# Patient Record
Sex: Male | Born: 1955 | Race: White | Hispanic: No | Marital: Married | Smoking: Former smoker
Health system: Southern US, Community
[De-identification: ages and names within clinical notes are randomized; demographics above are authoritative.]

## PROBLEM LIST (undated history)

## (undated) DIAGNOSIS — I10 Essential (primary) hypertension: Secondary | ICD-10-CM

## (undated) DIAGNOSIS — J42 Unspecified chronic bronchitis: Secondary | ICD-10-CM

## (undated) DIAGNOSIS — F329 Major depressive disorder, single episode, unspecified: Secondary | ICD-10-CM

## (undated) DIAGNOSIS — F419 Anxiety disorder, unspecified: Secondary | ICD-10-CM

## (undated) DIAGNOSIS — E7800 Pure hypercholesterolemia, unspecified: Secondary | ICD-10-CM

## (undated) DIAGNOSIS — N390 Urinary tract infection, site not specified: Secondary | ICD-10-CM

## (undated) DIAGNOSIS — J189 Pneumonia, unspecified organism: Secondary | ICD-10-CM

## (undated) DIAGNOSIS — I4891 Unspecified atrial fibrillation: Secondary | ICD-10-CM

## (undated) DIAGNOSIS — G4733 Obstructive sleep apnea (adult) (pediatric): Secondary | ICD-10-CM

## (undated) DIAGNOSIS — E109 Type 1 diabetes mellitus without complications: Secondary | ICD-10-CM

## (undated) DIAGNOSIS — K219 Gastro-esophageal reflux disease without esophagitis: Secondary | ICD-10-CM

## (undated) DIAGNOSIS — N189 Chronic kidney disease, unspecified: Secondary | ICD-10-CM

## (undated) DIAGNOSIS — Z9989 Dependence on other enabling machines and devices: Secondary | ICD-10-CM

## (undated) DIAGNOSIS — F32A Depression, unspecified: Secondary | ICD-10-CM

## (undated) DIAGNOSIS — L0390 Cellulitis, unspecified: Secondary | ICD-10-CM

## (undated) HISTORY — PX: URETHRAL DILATION: SUR417

## (undated) HISTORY — PX: COLONOSCOPY: SHX174

## (undated) HISTORY — PX: TONSILLECTOMY: SUR1361

## (undated) HISTORY — PX: HIP ARTHROPLASTY: SHX981

---

## 2006-01-06 ENCOUNTER — Encounter: Admission: RE | Admit: 2006-01-06 | Discharge: 2006-01-06 | Payer: Self-pay | Admitting: Orthopaedic Surgery

## 2006-01-15 HISTORY — PX: ANTERIOR CERVICAL DECOMP/DISCECTOMY FUSION: SHX1161

## 2006-02-03 ENCOUNTER — Observation Stay (HOSPITAL_COMMUNITY): Admission: RE | Admit: 2006-02-03 | Discharge: 2006-02-04 | Payer: Self-pay | Admitting: Orthopaedic Surgery

## 2008-02-16 HISTORY — PX: TOTAL SHOULDER ARTHROPLASTY: SHX126

## 2008-03-09 ENCOUNTER — Ambulatory Visit (HOSPITAL_COMMUNITY): Admission: RE | Admit: 2008-03-09 | Discharge: 2008-03-10 | Payer: Self-pay | Admitting: Orthopedic Surgery

## 2010-01-11 ENCOUNTER — Observation Stay (HOSPITAL_COMMUNITY): Admission: RE | Admit: 2010-01-11 | Discharge: 2010-01-12 | Payer: Self-pay | Admitting: Orthopaedic Surgery

## 2010-01-22 ENCOUNTER — Ambulatory Visit (HOSPITAL_COMMUNITY): Admission: RE | Admit: 2010-01-22 | Discharge: 2010-01-22 | Payer: Self-pay | Admitting: Urology

## 2010-02-19 ENCOUNTER — Inpatient Hospital Stay (HOSPITAL_COMMUNITY): Admission: RE | Admit: 2010-02-19 | Discharge: 2010-03-04 | Payer: Self-pay | Admitting: Orthopaedic Surgery

## 2010-02-20 ENCOUNTER — Ambulatory Visit: Payer: Self-pay | Admitting: Cardiovascular Disease

## 2010-02-20 ENCOUNTER — Ambulatory Visit: Payer: Self-pay | Admitting: Internal Medicine

## 2010-02-21 ENCOUNTER — Encounter (INDEPENDENT_AMBULATORY_CARE_PROVIDER_SITE_OTHER): Payer: Self-pay | Admitting: Internal Medicine

## 2010-02-25 ENCOUNTER — Encounter: Payer: Self-pay | Admitting: Pulmonary Disease

## 2010-02-28 ENCOUNTER — Ambulatory Visit: Payer: Self-pay | Admitting: Physical Medicine & Rehabilitation

## 2010-03-08 ENCOUNTER — Encounter: Payer: Self-pay | Admitting: Cardiovascular Disease

## 2010-03-17 HISTORY — PX: INCISION AND DRAINAGE HIP: SHX1801

## 2010-03-18 ENCOUNTER — Inpatient Hospital Stay (HOSPITAL_COMMUNITY): Admission: EM | Admit: 2010-03-18 | Discharge: 2010-03-23 | Payer: Self-pay | Admitting: Orthopaedic Surgery

## 2010-04-16 DIAGNOSIS — Z87898 Personal history of other specified conditions: Secondary | ICD-10-CM | POA: Insufficient documentation

## 2010-04-16 DIAGNOSIS — E785 Hyperlipidemia, unspecified: Secondary | ICD-10-CM

## 2010-04-16 DIAGNOSIS — F329 Major depressive disorder, single episode, unspecified: Secondary | ICD-10-CM

## 2010-04-16 DIAGNOSIS — E119 Type 2 diabetes mellitus without complications: Secondary | ICD-10-CM

## 2010-04-18 ENCOUNTER — Encounter (INDEPENDENT_AMBULATORY_CARE_PROVIDER_SITE_OTHER): Payer: Self-pay | Admitting: *Deleted

## 2010-12-17 NOTE — Procedures (Signed)
Summary: Summary Report  Summary Report   Imported By: Erle Crocker 04/19/2010 16:35:15  _____________________________________________________________________  External Attachment:    Type:   Image     Comment:   External Document

## 2010-12-17 NOTE — Letter (Signed)
Summary: Appointment - Missed  [facility], Main Office  [address]   [place], [place] [address]   Phone: [phone]  Fax: [phone]     April 18, 2010 MRN: [id number]   Jack Barry [address] [place], [place]  [address]   Dear Mr. Certain,  Our records indicate you missed your appointment on 6-1-1 with Dr Johney Frame. It is very important that we reach you to reschedule this appointment. We look forward to participating in your health care needs. Please contact us at the number listed above at your earliest convenience to reschedule this appointment.     Sincerely,   Ruel Favors Scheduling Team

## 2011-02-04 LAB — CBC
HCT: 26.3 % — ABNORMAL LOW (ref 39.0–52.0)
HCT: 25.7 % — ABNORMAL LOW (ref 39.0–52.0)
HCT: 26.8 % — ABNORMAL LOW (ref 39.0–52.0)
HCT: 26.9 % — ABNORMAL LOW (ref 39.0–52.0)
Hemoglobin: 10 g/dL — ABNORMAL LOW (ref 13.0–17.0)
Hemoglobin: 8 g/dL — ABNORMAL LOW (ref 13.0–17.0)
Hemoglobin: 9 g/dL — ABNORMAL LOW (ref 13.0–17.0)
Hemoglobin: 9.5 g/dL — ABNORMAL LOW (ref 13.0–17.0)
MCHC: 34.4 g/dL (ref 30.0–36.0)
MCHC: 35.3 g/dL (ref 30.0–36.0)
MCHC: 34.8 g/dL (ref 30.0–36.0)
MCHC: 35.1 g/dL (ref 30.0–36.0)
MCV: 88.2 fL (ref 78.0–100.0)
MCV: 88.4 fL (ref 78.0–100.0)
MCV: 92.5 fL (ref 78.0–100.0)
MCV: 93 fL (ref 78.0–100.0)
Platelets: 430 10*3/uL — ABNORMAL HIGH (ref 150–400)
Platelets: 432 10*3/uL — ABNORMAL HIGH (ref 150–400)
Platelets: 465 10*3/uL — ABNORMAL HIGH (ref 150–400)
Platelets: 535 10*3/uL — ABNORMAL HIGH (ref 150–400)
Platelets: 352 10*3/uL (ref 150–400)
RBC: 2.9 MIL/uL — ABNORMAL LOW (ref 4.22–5.81)
RBC: 3.31 MIL/uL — ABNORMAL LOW (ref 4.22–5.81)
RBC: 2.78 MIL/uL — ABNORMAL LOW (ref 4.22–5.81)
RBC: 2.84 MIL/uL — ABNORMAL LOW (ref 4.22–5.81)
RDW: 14.6 % (ref 11.5–15.5)
RDW: 14.8 % (ref 11.5–15.5)
RDW: 15 % (ref 11.5–15.5)
RDW: 15 % (ref 11.5–15.5)
RDW: 14.9 % (ref 11.5–15.5)
RDW: 15.1 % (ref 11.5–15.5)
WBC: 10.2 10*3/uL (ref 4.0–10.5)
WBC: 8.4 10*3/uL (ref 4.0–10.5)
WBC: 9.8 10*3/uL (ref 4.0–10.5)
WBC: 10 10*3/uL (ref 4.0–10.5)
WBC: 11.2 10*3/uL — ABNORMAL HIGH (ref 4.0–10.5)

## 2011-02-04 LAB — PROTIME-INR
INR: 1.84 — ABNORMAL HIGH (ref 0.00–1.49)
INR: 1.43 (ref 0.00–1.49)
INR: 1.56 — ABNORMAL HIGH (ref 0.00–1.49)
Prothrombin Time: 21.1 seconds — ABNORMAL HIGH (ref 11.6–15.2)
Prothrombin Time: 22.5 seconds — ABNORMAL HIGH (ref 11.6–15.2)
Prothrombin Time: 15.7 seconds — ABNORMAL HIGH (ref 11.6–15.2)
Prothrombin Time: 18.5 seconds — ABNORMAL HIGH (ref 11.6–15.2)

## 2011-02-04 LAB — BASIC METABOLIC PANEL
BUN: 11 mg/dL (ref 6–23)
BUN: 13 mg/dL (ref 6–23)
BUN: 16 mg/dL (ref 6–23)
BUN: 9 mg/dL (ref 6–23)
CO2: 29 mEq/L (ref 19–32)
CO2: 32 mEq/L (ref 19–32)
CO2: 33 mEq/L — ABNORMAL HIGH (ref 19–32)
CO2: 31 mEq/L (ref 19–32)
CO2: 32 mEq/L (ref 19–32)
Calcium: 8.4 mg/dL (ref 8.4–10.5)
Calcium: 8.5 mg/dL (ref 8.4–10.5)
Calcium: 8.7 mg/dL (ref 8.4–10.5)
Calcium: 8.8 mg/dL (ref 8.4–10.5)
Calcium: 8.8 mg/dL (ref 8.4–10.5)
Calcium: 8.8 mg/dL (ref 8.4–10.5)
Calcium: 8.6 mg/dL (ref 8.4–10.5)
Chloride: 101 mEq/L (ref 96–112)
Chloride: 103 mEq/L (ref 96–112)
Chloride: 97 mEq/L (ref 96–112)
Creatinine, Ser: 1.6 mg/dL — ABNORMAL HIGH (ref 0.4–1.5)
Creatinine, Ser: 1.69 mg/dL — ABNORMAL HIGH (ref 0.4–1.5)
Creatinine, Ser: 1.72 mg/dL — ABNORMAL HIGH (ref 0.4–1.5)
Creatinine, Ser: 0.92 mg/dL (ref 0.4–1.5)
Creatinine, Ser: 0.92 mg/dL (ref 0.4–1.5)
GFR calc Af Amer: 51 mL/min — ABNORMAL LOW (ref >60)
GFR calc Af Amer: 52 mL/min — ABNORMAL LOW (ref >60)
GFR calc Af Amer: 55 mL/min — ABNORMAL LOW (ref >60)
GFR calc Af Amer: >60 mL/min (ref >60)
GFR calc Af Amer: >60 mL/min (ref >60)
GFR calc Af Amer: >60 mL/min (ref >60)
GFR calc non Af Amer: 43 mL/min — ABNORMAL LOW (ref >60)
GFR calc non Af Amer: 44 mL/min — ABNORMAL LOW (ref >60)
GFR calc non Af Amer: 46 mL/min — ABNORMAL LOW (ref >60)
GFR calc non Af Amer: >60 mL/min (ref >60)
GFR calc non Af Amer: >60 mL/min (ref >60)
GFR calc non Af Amer: >60 mL/min (ref >60)
GFR calc non Af Amer: >60 mL/min (ref >60)
Glucose, Bld: 83 mg/dL (ref 70–99)
Glucose, Bld: 90 mg/dL (ref 70–99)
Glucose, Bld: 99 mg/dL (ref 70–99)
Glucose, Bld: 120 mg/dL — ABNORMAL HIGH (ref 70–99)
Glucose, Bld: 132 mg/dL — ABNORMAL HIGH (ref 70–99)
Potassium: 3.9 mEq/L (ref 3.5–5.1)
Potassium: 3.6 mEq/L (ref 3.5–5.1)
Potassium: 3.9 mEq/L (ref 3.5–5.1)
Sodium: 135 mEq/L (ref 135–145)
Sodium: 137 mEq/L (ref 135–145)
Sodium: 137 mEq/L (ref 135–145)
Sodium: 139 mEq/L (ref 135–145)
Sodium: 134 mEq/L — ABNORMAL LOW (ref 135–145)

## 2011-02-04 LAB — SEDIMENTATION RATE
Sed Rate: 101 mm/hr — ABNORMAL HIGH (ref 0–16)
Sed Rate: 99 mm/hr — ABNORMAL HIGH (ref 0–16)

## 2011-02-04 LAB — GLUCOSE, CAPILLARY
Glucose-Capillary: 104 mg/dL — ABNORMAL HIGH (ref 70–99)
Glucose-Capillary: 110 mg/dL — ABNORMAL HIGH (ref 70–99)
Glucose-Capillary: 117 mg/dL — ABNORMAL HIGH (ref 70–99)
Glucose-Capillary: 130 mg/dL — ABNORMAL HIGH (ref 70–99)
Glucose-Capillary: 142 mg/dL — ABNORMAL HIGH (ref 70–99)
Glucose-Capillary: 68 mg/dL — ABNORMAL LOW (ref 70–99)
Glucose-Capillary: 84 mg/dL (ref 70–99)
Glucose-Capillary: 86 mg/dL (ref 70–99)
Glucose-Capillary: 89 mg/dL (ref 70–99)
Glucose-Capillary: 93 mg/dL (ref 70–99)
Glucose-Capillary: 94 mg/dL (ref 70–99)
Glucose-Capillary: 97 mg/dL (ref 70–99)
Glucose-Capillary: 105 mg/dL — ABNORMAL HIGH (ref 70–99)
Glucose-Capillary: 122 mg/dL — ABNORMAL HIGH (ref 70–99)
Glucose-Capillary: 134 mg/dL — ABNORMAL HIGH (ref 70–99)
Glucose-Capillary: 135 mg/dL — ABNORMAL HIGH (ref 70–99)
Glucose-Capillary: 140 mg/dL — ABNORMAL HIGH (ref 70–99)
Glucose-Capillary: 143 mg/dL — ABNORMAL HIGH (ref 70–99)

## 2011-02-04 LAB — CROSSMATCH

## 2011-02-04 LAB — CK ISOENZYMES
CK-MB: 0 % (ref <5)
Creatine Kinase-Total: 140 U/L (ref 44–196)

## 2011-02-04 LAB — C-REACTIVE PROTEIN: CRP: 11.6 mg/dL — ABNORMAL HIGH (ref <0.6)

## 2011-02-04 LAB — HEPARIN LEVEL (UNFRACTIONATED): Heparin Unfractionated: 0.47 IU/mL (ref 0.30–0.70)

## 2011-02-04 LAB — HEMOGLOBIN A1C: Mean Plasma Glucose: 120 mg/dL — ABNORMAL HIGH (ref <117)

## 2011-02-04 LAB — LIPASE, BLOOD: Lipase: 36 U/L (ref 11–59)

## 2011-02-05 LAB — CROSSMATCH
ABO/RH(D): A POS
ABO/RH(D): A POS

## 2011-02-05 LAB — GLUCOSE, CAPILLARY
Glucose-Capillary: 124 mg/dL — ABNORMAL HIGH (ref 70–99)
Glucose-Capillary: 150 mg/dL — ABNORMAL HIGH (ref 70–99)
Glucose-Capillary: 155 mg/dL — ABNORMAL HIGH (ref 70–99)
Glucose-Capillary: 157 mg/dL — ABNORMAL HIGH (ref 70–99)
Glucose-Capillary: 159 mg/dL — ABNORMAL HIGH (ref 70–99)
Glucose-Capillary: 159 mg/dL — ABNORMAL HIGH (ref 70–99)
Glucose-Capillary: 160 mg/dL — ABNORMAL HIGH (ref 70–99)
Glucose-Capillary: 161 mg/dL — ABNORMAL HIGH (ref 70–99)
Glucose-Capillary: 164 mg/dL — ABNORMAL HIGH (ref 70–99)
Glucose-Capillary: 169 mg/dL — ABNORMAL HIGH (ref 70–99)
Glucose-Capillary: 169 mg/dL — ABNORMAL HIGH (ref 70–99)
Glucose-Capillary: 174 mg/dL — ABNORMAL HIGH (ref 70–99)
Glucose-Capillary: 177 mg/dL — ABNORMAL HIGH (ref 70–99)
Glucose-Capillary: 185 mg/dL — ABNORMAL HIGH (ref 70–99)
Glucose-Capillary: 189 mg/dL — ABNORMAL HIGH (ref 70–99)
Glucose-Capillary: 194 mg/dL — ABNORMAL HIGH (ref 70–99)
Glucose-Capillary: 199 mg/dL — ABNORMAL HIGH (ref 70–99)
Glucose-Capillary: 200 mg/dL — ABNORMAL HIGH (ref 70–99)
Glucose-Capillary: 207 mg/dL — ABNORMAL HIGH (ref 70–99)
Glucose-Capillary: 212 mg/dL — ABNORMAL HIGH (ref 70–99)
Glucose-Capillary: 215 mg/dL — ABNORMAL HIGH (ref 70–99)
Glucose-Capillary: 224 mg/dL — ABNORMAL HIGH (ref 70–99)
Glucose-Capillary: 137 mg/dL — ABNORMAL HIGH (ref 70–99)
Glucose-Capillary: 162 mg/dL — ABNORMAL HIGH (ref 70–99)
Glucose-Capillary: 194 mg/dL — ABNORMAL HIGH (ref 70–99)
Glucose-Capillary: 231 mg/dL — ABNORMAL HIGH (ref 70–99)

## 2011-02-05 LAB — URINALYSIS, ROUTINE W REFLEX MICROSCOPIC
Bilirubin Urine: NEGATIVE
Ketones, ur: NEGATIVE mg/dL
Specific Gravity, Urine: 1.02 (ref 1.005–1.030)
Urobilinogen, UA: 0.2 mg/dL (ref 0.0–1.0)

## 2011-02-05 LAB — HEMOGLOBIN AND HEMATOCRIT, BLOOD: HCT: 32.4 % — ABNORMAL LOW (ref 39.0–52.0)

## 2011-02-05 LAB — BASIC METABOLIC PANEL
BUN: 13 mg/dL (ref 6–23)
BUN: 26 mg/dL — ABNORMAL HIGH (ref 6–23)
BUN: 33 mg/dL — ABNORMAL HIGH (ref 6–23)
CO2: 29 mEq/L (ref 19–32)
CO2: 32 mEq/L (ref 19–32)
CO2: 27 mEq/L (ref 19–32)
Calcium: 7.8 mg/dL — ABNORMAL LOW (ref 8.4–10.5)
Calcium: 7.1 mg/dL — ABNORMAL LOW (ref 8.4–10.5)
Calcium: 7.1 mg/dL — ABNORMAL LOW (ref 8.4–10.5)
Chloride: 103 mEq/L (ref 96–112)
Chloride: 104 mEq/L (ref 96–112)
Chloride: 99 mEq/L (ref 96–112)
Chloride: 103 mEq/L (ref 96–112)
Creatinine, Ser: 0.96 mg/dL (ref 0.4–1.5)
GFR calc Af Amer: >60 mL/min (ref >60)
GFR calc Af Amer: >60 mL/min (ref >60)
GFR calc Af Amer: >60 mL/min (ref >60)
GFR calc Af Amer: 29 mL/min — ABNORMAL LOW (ref >60)
GFR calc non Af Amer: >60 mL/min (ref >60)
GFR calc non Af Amer: 18 mL/min — ABNORMAL LOW (ref >60)
GFR calc non Af Amer: 24 mL/min — ABNORMAL LOW (ref >60)
GFR calc non Af Amer: 26 mL/min — ABNORMAL LOW (ref >60)
Glucose, Bld: 157 mg/dL — ABNORMAL HIGH (ref 70–99)
Glucose, Bld: 175 mg/dL — ABNORMAL HIGH (ref 70–99)
Glucose, Bld: 178 mg/dL — ABNORMAL HIGH (ref 70–99)
Glucose, Bld: 218 mg/dL — ABNORMAL HIGH (ref 70–99)
Potassium: 3.8 mEq/L (ref 3.5–5.1)
Potassium: 5.4 mEq/L — ABNORMAL HIGH (ref 3.5–5.1)
Sodium: 136 mEq/L (ref 135–145)
Sodium: 139 mEq/L (ref 135–145)
Sodium: 139 mEq/L (ref 135–145)
Sodium: 135 mEq/L (ref 135–145)

## 2011-02-05 LAB — CBC
HCT: 21.5 % — ABNORMAL LOW (ref 39.0–52.0)
HCT: 23.7 % — ABNORMAL LOW (ref 39.0–52.0)
HCT: 24.7 % — ABNORMAL LOW (ref 39.0–52.0)
HCT: 25.2 % — ABNORMAL LOW (ref 39.0–52.0)
HCT: 27.1 % — ABNORMAL LOW (ref 39.0–52.0)
Hemoglobin: 8.3 g/dL — ABNORMAL LOW (ref 13.0–17.0)
Hemoglobin: 8.7 g/dL — ABNORMAL LOW (ref 13.0–17.0)
Hemoglobin: 8.8 g/dL — ABNORMAL LOW (ref 13.0–17.0)
Hemoglobin: 9.4 g/dL — ABNORMAL LOW (ref 13.0–17.0)
Hemoglobin: 9.6 g/dL — ABNORMAL LOW (ref 13.0–17.0)
MCHC: 34.6 g/dL (ref 30.0–36.0)
MCHC: 34.7 g/dL (ref 30.0–36.0)
MCHC: 34.8 g/dL (ref 30.0–36.0)
MCV: 92.1 fL (ref 78.0–100.0)
MCV: 92.4 fL (ref 78.0–100.0)
MCV: 94.3 fL (ref 78.0–100.0)
MCV: 94.4 fL (ref 78.0–100.0)
MCV: 94.5 fL (ref 78.0–100.0)
MCV: 94.9 fL (ref 78.0–100.0)
Platelets: 163 10*3/uL (ref 150–400)
Platelets: 180 10*3/uL (ref 150–400)
Platelets: 191 10*3/uL (ref 150–400)
Platelets: 223 10*3/uL (ref 150–400)
Platelets: 331 10*3/uL (ref 150–400)
Platelets: 409 10*3/uL — ABNORMAL HIGH (ref 150–400)
Platelets: 261 10*3/uL (ref 150–400)
RBC: 2.53 MIL/uL — ABNORMAL LOW (ref 4.22–5.81)
RBC: 2.67 MIL/uL — ABNORMAL LOW (ref 4.22–5.81)
RBC: 2.67 MIL/uL — ABNORMAL LOW (ref 4.22–5.81)
RBC: 2.93 MIL/uL — ABNORMAL LOW (ref 4.22–5.81)
RBC: 2.99 MIL/uL — ABNORMAL LOW (ref 4.22–5.81)
RBC: 3.19 MIL/uL — ABNORMAL LOW (ref 4.22–5.81)
RDW: 13.8 % (ref 11.5–15.5)
RDW: 14.1 % (ref 11.5–15.5)
RDW: 14.2 % (ref 11.5–15.5)
RDW: 14.3 % (ref 11.5–15.5)
RDW: 14.4 % (ref 11.5–15.5)
RDW: 14.6 % (ref 11.5–15.5)
RDW: 14.4 % (ref 11.5–15.5)
WBC: 10.6 10*3/uL — ABNORMAL HIGH (ref 4.0–10.5)
WBC: 12.1 10*3/uL — ABNORMAL HIGH (ref 4.0–10.5)
WBC: 12.4 10*3/uL — ABNORMAL HIGH (ref 4.0–10.5)
WBC: 15.3 10*3/uL — ABNORMAL HIGH (ref 4.0–10.5)
WBC: 15.9 10*3/uL — ABNORMAL HIGH (ref 4.0–10.5)
WBC: 16.4 10*3/uL — ABNORMAL HIGH (ref 4.0–10.5)

## 2011-02-05 LAB — POCT I-STAT EG7
Calcium, Ion: 1.12 mmol/L (ref 1.12–1.32)
O2 Saturation: 87 %
Potassium: 5.4 mEq/L — ABNORMAL HIGH (ref 3.5–5.1)
TCO2: 30 mmol/L (ref 0–100)
pH, Ven: 7.251 (ref 7.250–7.300)

## 2011-02-05 LAB — POCT I-STAT 7, (LYTES, BLD GAS, ICA,H+H)
Calcium, Ion: 1.16 mmol/L (ref 1.12–1.32)
HCT: 34 % — ABNORMAL LOW (ref 39.0–52.0)
O2 Saturation: 98 %
Potassium: 4.5 mEq/L (ref 3.5–5.1)
pCO2 arterial: 41.8 mmHg (ref 35.0–45.0)
pH, Arterial: 7.409 (ref 7.350–7.450)

## 2011-02-05 LAB — RENAL FUNCTION PANEL
Albumin: 2.3 g/dL — ABNORMAL LOW (ref 3.5–5.2)
Albumin: 2.4 g/dL — ABNORMAL LOW (ref 3.5–5.2)
Albumin: 2.4 g/dL — ABNORMAL LOW (ref 3.5–5.2)
BUN: 26 mg/dL — ABNORMAL HIGH (ref 6–23)
BUN: 36 mg/dL — ABNORMAL HIGH (ref 6–23)
CO2: 31 mEq/L (ref 19–32)
Calcium: 7.9 mg/dL — ABNORMAL LOW (ref 8.4–10.5)
Chloride: 102 mEq/L (ref 96–112)
Creatinine, Ser: 1.11 mg/dL (ref 0.4–1.5)
Creatinine, Ser: 2.84 mg/dL — ABNORMAL HIGH (ref 0.4–1.5)
GFR calc Af Amer: 28 mL/min — ABNORMAL LOW (ref >60)
GFR calc Af Amer: >60 mL/min (ref >60)
GFR calc non Af Amer: 52 mL/min — ABNORMAL LOW (ref >60)
GFR calc non Af Amer: >60 mL/min (ref >60)
Glucose, Bld: 169 mg/dL — ABNORMAL HIGH (ref 70–99)
Phosphorus: 4.1 mg/dL (ref 2.3–4.6)
Potassium: 4.8 mEq/L (ref 3.5–5.1)
Sodium: 138 mEq/L (ref 135–145)

## 2011-02-05 LAB — TYPE AND SCREEN

## 2011-02-05 LAB — CK TOTAL AND CKMB (NOT AT ARMC): Relative Index: 0.5 (ref 0.0–2.5)

## 2011-02-05 LAB — MRSA PCR SCREENING
MRSA by PCR: NEGATIVE
MRSA by PCR: NEGATIVE
MRSA by PCR: NEGATIVE

## 2011-02-05 LAB — CARBOXYHEMOGLOBIN: Carboxyhemoglobin: 0.5 % (ref 0.5–1.5)

## 2011-02-05 LAB — COMPREHENSIVE METABOLIC PANEL
AST: 161 U/L — ABNORMAL HIGH (ref 0–37)
Albumin: 2.3 g/dL — ABNORMAL LOW (ref 3.5–5.2)
Alkaline Phosphatase: 36 U/L — ABNORMAL LOW (ref 39–117)
BUN: 30 mg/dL — ABNORMAL HIGH (ref 6–23)
Chloride: 100 mEq/L (ref 96–112)
GFR calc Af Amer: 49 mL/min — ABNORMAL LOW (ref >60)
Potassium: 4 mEq/L (ref 3.5–5.1)
Sodium: 136 mEq/L (ref 135–145)
Total Protein: 4.9 g/dL — ABNORMAL LOW (ref 6.0–8.3)

## 2011-02-05 LAB — PHOSPHORUS: Phosphorus: 2.5 mg/dL (ref 2.3–4.6)

## 2011-02-05 LAB — POCT I-STAT 4, (NA,K, GLUC, HGB,HCT)
HCT: 25 % — ABNORMAL LOW (ref 39.0–52.0)
Sodium: 137 mEq/L (ref 135–145)

## 2011-02-05 LAB — POCT I-STAT 3, ART BLOOD GAS (G3+)
Bicarbonate: 29.2 mEq/L — ABNORMAL HIGH (ref 20.0–24.0)
O2 Saturation: 96 %
TCO2: 31 mmol/L (ref 0–100)
pCO2 arterial: 53.3 mmHg — ABNORMAL HIGH (ref 35.0–45.0)
pH, Arterial: 7.347 — ABNORMAL LOW (ref 7.350–7.450)
pO2, Arterial: 87 mmHg (ref 80.0–100.0)

## 2011-02-05 LAB — PROTIME-INR
INR: 1.12 (ref 0.00–1.49)
Prothrombin Time: 14.3 seconds (ref 11.6–15.2)
Prothrombin Time: 16 seconds — ABNORMAL HIGH (ref 11.6–15.2)

## 2011-02-05 LAB — HEPARIN LEVEL (UNFRACTIONATED)
Heparin Unfractionated: 0.1 IU/mL — ABNORMAL LOW (ref 0.30–0.70)
Heparin Unfractionated: 0.16 IU/mL — ABNORMAL LOW (ref 0.30–0.70)
Heparin Unfractionated: 0.22 IU/mL — ABNORMAL LOW (ref 0.30–0.70)
Heparin Unfractionated: 0.27 IU/mL — ABNORMAL LOW (ref 0.30–0.70)
Heparin Unfractionated: 0.43 IU/mL (ref 0.30–0.70)

## 2011-02-05 LAB — FERRITIN: Ferritin: 291 ng/mL (ref 22–322)

## 2011-02-05 LAB — URINE MICROSCOPIC-ADD ON

## 2011-02-05 LAB — URINE CULTURE: Culture: NO GROWTH

## 2011-02-05 LAB — MAGNESIUM: Magnesium: 1.7 mg/dL (ref 1.5–2.5)

## 2011-02-06 LAB — CBC
HCT: 48 % (ref 39.0–52.0)
Hemoglobin: 16.6 g/dL (ref 13.0–17.0)
MCV: 94.4 fL (ref 78.0–100.0)
RBC: 5.09 MIL/uL (ref 4.22–5.81)
WBC: 8.9 10*3/uL (ref 4.0–10.5)

## 2011-02-06 LAB — COMPREHENSIVE METABOLIC PANEL
Alkaline Phosphatase: 59 U/L (ref 39–117)
BUN: 12 mg/dL (ref 6–23)
CO2: 32 mEq/L (ref 19–32)
Chloride: 99 mEq/L (ref 96–112)
Creatinine, Ser: 0.76 mg/dL (ref 0.4–1.5)
GFR calc non Af Amer: >60 mL/min (ref >60)
Glucose, Bld: 174 mg/dL — ABNORMAL HIGH (ref 70–99)
Potassium: 3.9 mEq/L (ref 3.5–5.1)
Total Bilirubin: 0.8 mg/dL (ref 0.3–1.2)

## 2011-02-06 LAB — URINALYSIS, MICROSCOPIC ONLY
Ketones, ur: NEGATIVE mg/dL
Protein, ur: 100 mg/dL — AB
Urobilinogen, UA: 1 mg/dL (ref 0.0–1.0)

## 2011-02-06 LAB — GLUCOSE, CAPILLARY
Glucose-Capillary: 150 mg/dL — ABNORMAL HIGH (ref 70–99)
Glucose-Capillary: 188 mg/dL — ABNORMAL HIGH (ref 70–99)

## 2011-02-06 LAB — URINE CULTURE
Colony Count: 30000
Special Requests: NEGATIVE

## 2011-02-06 LAB — PROTIME-INR: INR: 1 (ref 0.00–1.49)

## 2011-02-10 LAB — COMPREHENSIVE METABOLIC PANEL
ALT: 36 U/L (ref 0–53)
AST: 27 U/L (ref 0–37)
Albumin: 4.1 g/dL (ref 3.5–5.2)
Alkaline Phosphatase: 67 U/L (ref 39–117)
BUN: 13 mg/dL (ref 6–23)
CO2: 30 mEq/L (ref 19–32)
Calcium: 9.6 mg/dL (ref 8.4–10.5)
Chloride: 100 mEq/L (ref 96–112)
Creatinine, Ser: 0.69 mg/dL (ref 0.4–1.5)
GFR calc Af Amer: >60 mL/min (ref >60)
GFR calc non Af Amer: >60 mL/min (ref >60)
Glucose, Bld: 97 mg/dL (ref 70–99)
Potassium: 3.9 mEq/L (ref 3.5–5.1)
Sodium: 138 mEq/L (ref 135–145)
Total Bilirubin: 0.8 mg/dL (ref 0.3–1.2)
Total Protein: 7.6 g/dL (ref 6.0–8.3)

## 2011-02-10 LAB — CBC
HCT: 48 % (ref 39.0–52.0)
Hemoglobin: 16.7 g/dL (ref 13.0–17.0)
MCHC: 34.7 g/dL (ref 30.0–36.0)
MCV: 94.9 fL (ref 78.0–100.0)
Platelets: 280 10*3/uL (ref 150–400)
RBC: 5.05 MIL/uL (ref 4.22–5.81)
RDW: 13.5 % (ref 11.5–15.5)
WBC: 9.1 10*3/uL (ref 4.0–10.5)

## 2011-02-10 LAB — GLUCOSE, CAPILLARY: Glucose-Capillary: 98 mg/dL (ref 70–99)

## 2011-04-01 NOTE — Op Note (Signed)
NAME:  Jack Barry, Jack Barry NO.:  [id number]   MEDICAL RECORD NO.:  [id number]          PATIENT TYPE:  INP   LOCATION:  5041                         FACILITY:  [facility]   PHYSICIAN:  Vania Rea. Supple, M.D.  DATE OF BIRTH:  1956-07-24   DATE OF PROCEDURE:  03/09/2008  DATE OF DISCHARGE:                               OPERATIVE REPORT   PREOPERATIVE DIAGNOSIS:  End-stage right shoulder osteoarthrosis.   POSTOPERATIVE DIAGNOSIS:  End-stage right shoulder osteoarthrosis.   PROCEDURE:  Right total shoulder arthroplasty utilizing a Press-Fit size  16 DePuy global stem, a 52 x 21 concentric humeral head, and a 48 mm  pegged cemented glenoid.   SURGEON:  Vania Rea. Supple, MD   ASSISTANT:  Ralene Bathe PA-C   ANESTHESIA:  General endotracheal.   ESTIMATED BLOOD LOSS:  300 mL.   DRAINS:  None.   HISTORY:  Mr. Jack Barry is a 55 year old gentleman who has end-stage  right shoulder osteoarthrosis with progressively increasing pain and  functional limitations.  Due to her ongoing severe pain as well as  functional limitations and failure to respond to prolonged attempts at  conservative management, he was brought to the operating room.  At this  time, we planned right total shoulder arthroplasty.   Preoperatively, we counseled Mrs. Jack Barry all treatment options as  well as risks versus its benefits thereof.  Possible surgical  complications of bleeding, infection, neurovascular injury, persistent  pain, loss of motion, anesthetic complication, possible need for  additional surgery were reviewed.  He understands and accepts and agrees  with our planned procedure.   PROCEDURE IN DETAIL:  After undergoing routine preop evaluation, the  patient received prophylactic antibiotics.  An attempt was made to  establish an interscalene block in the holding area by the anesthesia  department, although this was not successful.  He was subsequently  transferred to the operating room  and placed supine on the operating  table and underwent smooth induction of a general endotracheal  anesthesia.  In the supine position, his torso was just minimally  elevated to approximately 10 degrees.  He had slid toward the right side  of the table and then the right extremity was sterilely prepped and  draped in standard fashion.  A standard deltopectoral incision was then  made approximately 30 cm in length beginning just proximal to the  coracoid process and extending laterally and distally.  Dissection  carried sharply through the skin and subcu tissue down to the  deltopectoral interval which was identified and electrocautery was used  for hemostasis.  The deltopectoral interval was then developed and  retractors were placed to open this interval.  We then performed a  tenotomy at the upper centimeter and a half of the pectoralis insertion.  The conjoint tendon was then identified, mobilized, and retracted  medially.  The bicipital groove was then opened and the biceps was  elevated and tenotomized and then dissection carried approximately along  the rotator interval dividing the cuff towards the base of the coracoid  process.  We then divided the insertion of the subscapularis from the  lesser tuberosity down  to its lower margin and divided the anterior  circumflex vessels and reflected and tacked the free margin of the  subscapularis with grasping #2 FiberWire sutures and then used this to  mobilize the subscapularis circumferentially.  We then externally  rotated the arm and divided the capsule insertions on the humeral head.  There was a very large humeral head osteophyte which was exposed and we  removed it with rongeur.  At this point, then we could elevate the  humeral head through the wound, and then using an extramedullary guide,  we identified the proper point for the osteotome for the osteotomy at  the humeral head.  We then carefully protected the superior posterior   aspect of rotator cuff and cut the humeral head with an oscillating saw.  We then used a canal finder to gain entrance into the humeral canal.  We  then performed a hand reaming up to a size 16 which had good purchase.  We then performed a series of broaching up to size 16 and we did confirm  that we had a proper degree of retroversion of the implant.  At this  point, our attention was then turned to the glenoid where we divided the  anterior capsule and placed an anterior glenoid retractor and then  carefully divided the capsular attachments at the periphery of the  glenoid with combination sharp and blunt dissection and remove the  radial remnants of the biceps stump as well as labral tissues  posteriorly, inferiorly, and anteriorly and meticulously exposed the  margins of the glenoid.  We then placed a posterior glenoid retractor  and we were able to appropriately visualize the glenoid.  It had just a  mild degree of retroversion.  We utilized a reamer to bring the glenoid  surface down to one consistent margin.  Reaming of the glenoid was  completed bringing to a smooth concentric surface circumferentially.  We  then placed a central drill hole and then from this drilled the  peripheral peg holes and then placed a trial implant which showed  excellent fit and good stability.  At this point, we meticulously  irrigated and cleaned glenoid face.  Cement was mixed in the appropriate  consistency.  It was introduced through a syringe into the peripheral  stabilizing holes care taken to compress cement into the peripheral bone  tunnels but taking care that no cement was allowed to encroach upon the  glenoid face.  The glenoid implant was then impacted into position which  was the 48-mm size.  It was held tightly into position until the cement  had completely hardened.  At this point, we turned our attention to the  proximal humerus where we performed trial reductions with a 52/18 head  and  this showed good soft tissue balancing coverage.  We went ahead and  tried the 52 x 21 and this actually showed a little bit better soft  tissue balance.  We went ahead and removed the trial and broach and  cleaned the humeral canal and then took the final size 16 implant and  impacted in position utilizing bone graft proximally to further  stabilize the implant and was now seen packed in position maintaining  approximately 30 degrees of retroversion and excellent stability was  achieved.  We then performed again trial reductions with the glenoid in  place with both the Ace 52 x 18 and 52 x 21 and 52 x 21 at the best soft  tissue balance allowing  80% translation of the humeral head and glenoid  posteriorly.  The final size 52 x 21 head was opened.  The Morse taper  on the humeral stem was meticulously cleaned and then the humeral head  was impacted into position.  Final reduction was performed.  Excellent  soft tissue balance was achieved and good mobility of shoulder was  achieved.  We then repaired the subscapularis through a series of 3 bone  tunnels on the lesser tuberosity using the #2 FiberWire after we had  confirmed that the subscapularis was completely mobilized.  We also  close the rotator interval with figure-of-eight #2 FiberWire sutures for  approximately 2 cm from the tuberosity.  Excellent soft tissue coverage  was achieved.  We then performed final irrigation and hemostasis.  The  deltopectoral interval was then allowed to close and was reapproximated  with a single stitch of #2 FiberWire to demarcate the deltopectoral  interval and the balance was then closed with 2-0 Vicryl which was also  used for the subcu layer, and then intracuticular 3-0 Monocryl was used  to close the skin followed by Steri-Strips.  A dry dressing was then  placed over the incision.  The right arm was placed in a sling  immobilizer.  The patient was then extubated and transferred to recovery  room  in stable condition.      Vania Rea. Supple, M.D.  Electronically Signed     KMS/MEDQ  D:  03/09/2008  T:  03/10/2008  Job:  [id number]

## 2011-04-04 NOTE — Op Note (Signed)
NAME:  Jack Barry, Jack Barry            ACCOUNT NO.:  [id number]   MEDICAL RECORD NO.:  [id number]          PATIENT TYPE:  AMB   LOCATION:  SDS                          FACILITY:  [facility]   PHYSICIAN:  Sharolyn Douglas, M.D.        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  Anterior cervical diskectomy C3-4 and C4-5 with decompression of the      spinal cord and nerve roots bilaterally.  2.  Anterior cervical arthrodesis C3-4 and C4-5 with placement of two      allograft prosthesis spacers packed with local autogenous bone graft.  3.  Anterior cervical plating C3-C5 using the Abbott spine system.   SURGEON:  Sharolyn Douglas, MD   ASSISTANT:  Verlin Fester, P.A.   ANESTHESIA:  General endotracheal.   ESTIMATED BLOOD LOSS:  Minimal.   COMPLICATIONS:  None.   ANESTHESIA:  General endotracheal.   INDICATIONS:  The patient is a pleasant 55 year old male who suffered a  hyperextension injury to his neck at work. Imaging studies demonstrated  severe spinal stenosis at C3-4 greater than C4-5 secondary to spondylosis  and underlying disk herniation. He failed to respond to additional  conservative measures. His symptomatology was consistent with an early  myelopathy. He has elected, at this point, to undergo decompression at C3-4  and C4-5 with anterior cervical fusion in the hopes of improving his  symptoms. The risks and benefits were discussed.   DESCRIPTION OF PROCEDURE:  The patient was identified in the holding area,  and taken to the operating room and underwent general tracheal anesthesia  without difficulty. He was given  prophylactic IV antibiotics. He was  carefully positioned with his neck in a neutral position. Care was taken,  during intubation, to avoid any extension of the cervical spine. Five pounds  of Halter traction was applied. He had a very large chin and neck and  positioning was required in order to gain access to the area of interest at  the thyroid cartilage. Tape was used to gently retract the adipose tissue  above his breast in order to further expose this area. The neck was then  prepped and draped in the usual sterile fashion. A transverse incision made  at the level of the thyroid cartilage on the left side. Dissection was  carried to the very deep adipose layer. The sternocleidomastoid muscle was  identified after incising the platysma sharply. The interval between the SCM  and strap muscles medially was developed down to the prevertebral space.   The esophagus, trachea, and carotid sheath were identified and protected at  all times. The deep prevertebral fascia was incised and the anterior  cervical spine was exposed. Spinal needle placed into the C3-4 disk space  and intraoperative x-ray confirmed this level. The longus coli  muscle was  elevated out over the C3-4 and C4-5 disk spaces, bilaterally. Deep Shadow-  line retractor placed. The Lenz 60-mm blades were used. The microscope was  draped and brought into the field. Caspar distraction pins placed into the  C3, C4, and C5 vertebral bodies. Gentle distraction was applied across the  C3-4 level. The disk was incised and a radical diskectomy carried back to  the posterior longitudinal ligament. We then took down the posterior  longitudinal ligament and a large subligamentous disk rupture was removed  which extended from left-to-right. Foraminotomies were completed. The  vertebral margins were undercut using micro Kerrisons. We felt that we had a  good decompression of the spinal cord and nerve roots bilaterally.  Hemostasis was  achieved. We then placed a 9-mm allograft prosthesis spacer  which was packed with local bone graft obtained from the drill which was  used previously to take down the uncovertebral joints. The allograft  prosthesis spacer was countersunk 2 mm.   We then performed a similar procedure at C4-5. At this level the segment  appeared hypermobile. Review of the CT scan revealed a slight  spondylolisthesis at this level. The posterior longitudinal ligament was,  again, taken down after radical discectomy; vertebral margins undercut. A  smaller subligamentous disk protrusion was identified. This seemed to extend  into the left neural foramen. Once we were satisfied with the decompression  at C4-5 and hemostasis was achieved, an 8-mm allograft prosthesis spacer was  packed with the drill shavings; again collected from the uncovertebral spurs  that were drilled out and the posterior vertebral margins. The allograft  prosthesis spacer was countersunk 2 mm. We then placed a 48-mm Abbott spine  anterior cervical plate from C3 down to C5 with six 13-mm locking screws. We  had excellent screw purchase. The bone quality was good. We ensured that the  locking mechanism engaged.   The esophagus, trachea, carotid sheath were examined. There were no apparent  injuries. Hemostasis was achieved. A deep Penrose drain was left. The  platysma was closed with interrupted 2-0 Vicryl suture. Subcutaneous layer  closed with interrupted 3-0 Vicryl followed by a running 4-0 subcuticular  Vicryl suture on the skin edges. Benzoin and Steri-Strips placed. Sterile  dressing applied. The needle and sponge count was correct. The patient was  extubated without difficulty, transferred to recovery in stable condition,  able to move his upper and lower extremities.      Sharolyn Douglas, M.D.  Electronically Signed     MC/MEDQ  D:  02/03/2006  T:  02/04/2006  Job:  [id number]

## 2011-08-12 LAB — COMPREHENSIVE METABOLIC PANEL
ALT: 45
Albumin: 3.9
Alkaline Phosphatase: 74
Calcium: 9.1
Glucose, Bld: 104 — ABNORMAL HIGH
Potassium: 4.4
Sodium: 136
Total Protein: 7.3

## 2011-08-12 LAB — URINALYSIS, ROUTINE W REFLEX MICROSCOPIC
Bilirubin Urine: NEGATIVE
Glucose, UA: NEGATIVE
Glucose, UA: NEGATIVE
Hgb urine dipstick: NEGATIVE
Ketones, ur: NEGATIVE
Protein, ur: 30 — AB
Protein, ur: NEGATIVE
Specific Gravity, Urine: 1.018
Urobilinogen, UA: 1
pH: 6.5

## 2011-08-12 LAB — CBC
MCHC: 35.1
Platelets: 304
RDW: 13.3

## 2011-08-12 LAB — URINE MICROSCOPIC-ADD ON

## 2011-08-12 LAB — PROTIME-INR
INR: 0.9
Prothrombin Time: 12.5

## 2011-11-20 IMAGING — CR DG CHEST 2V
2 series · 2 of 2 positions shown · non-contrast
Comparison: Chest 03/06/2008 and 02/03/2006

CLINICAL DATA: Preoperative respiratory film.

CHEST - 2 VIEW

[w chest pa *]
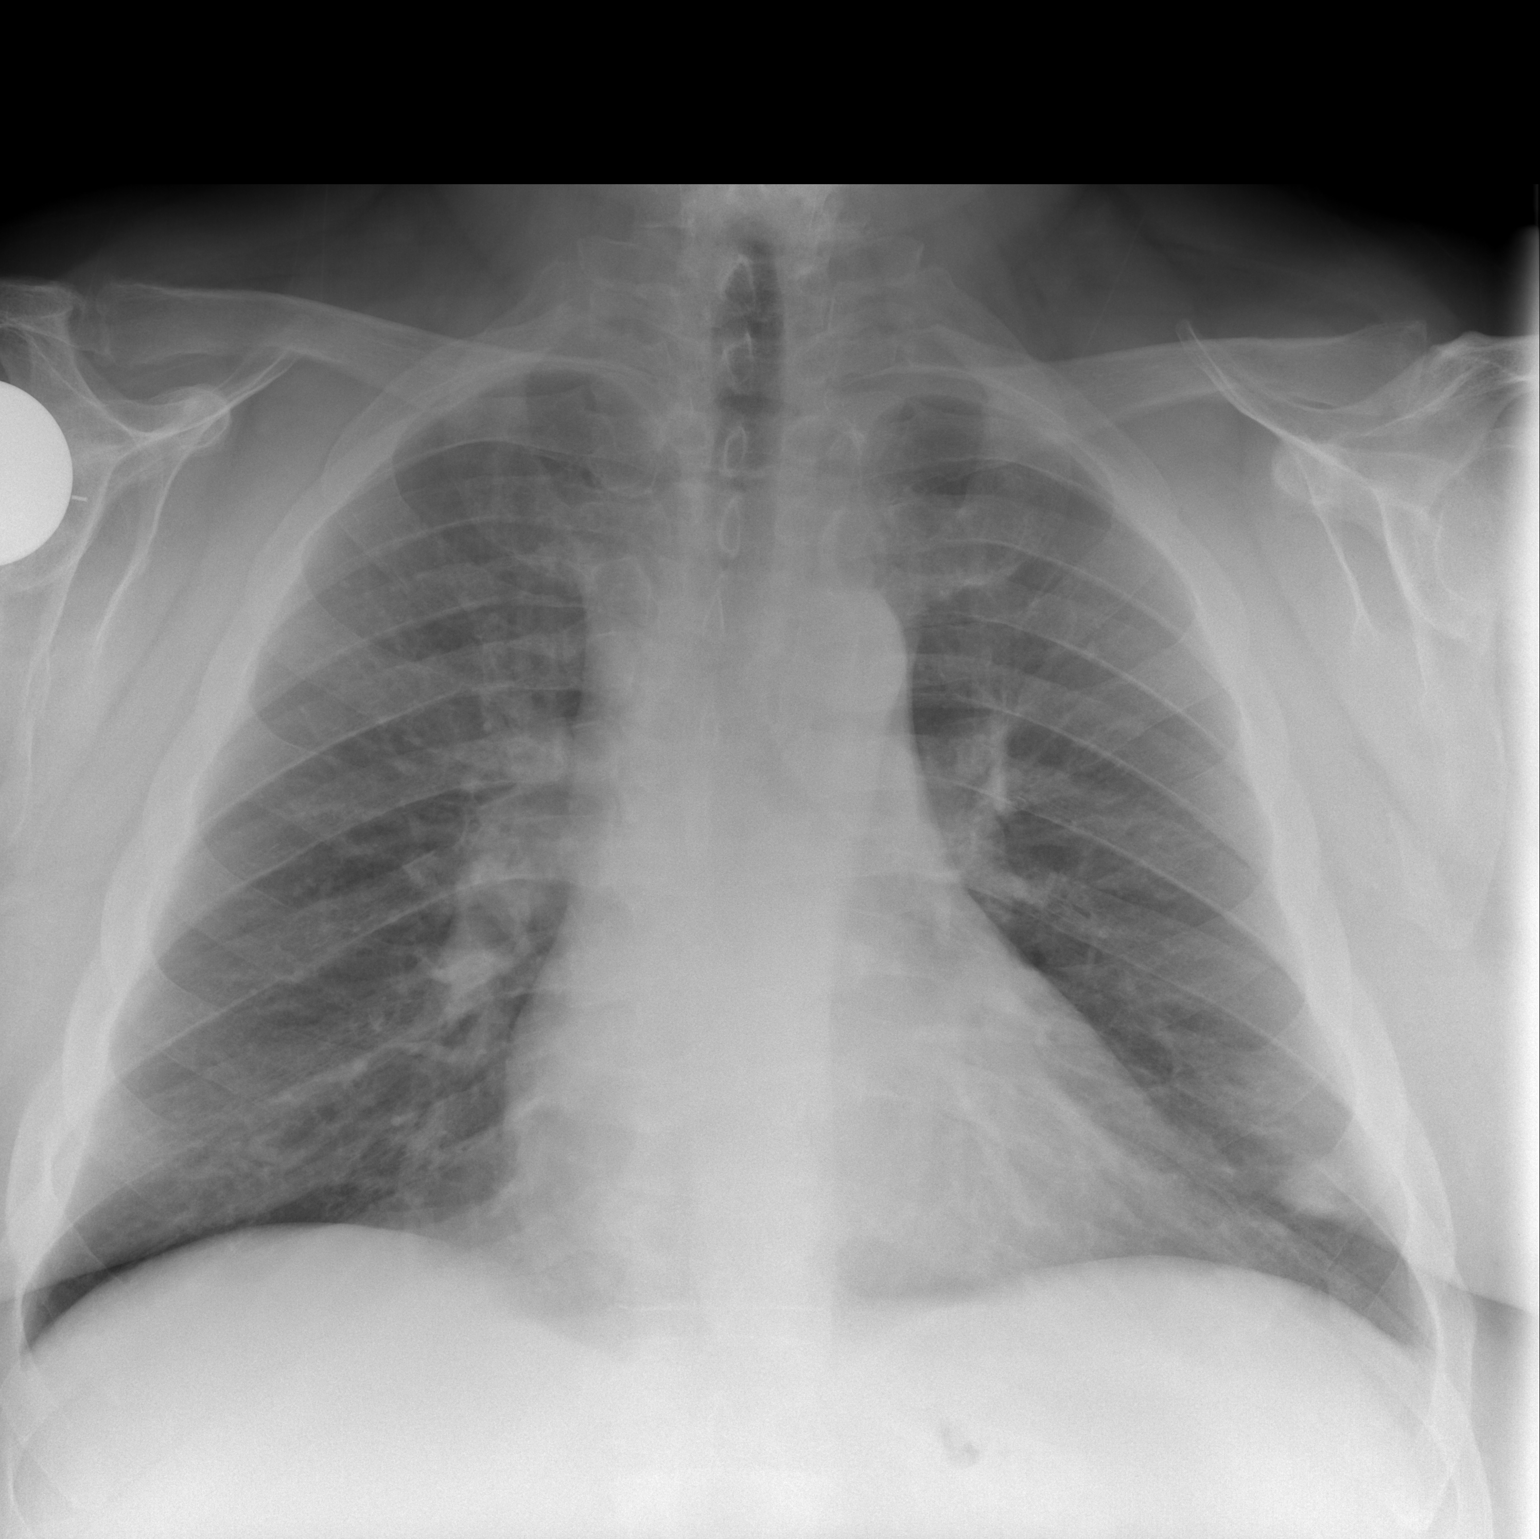

[w chest lat *]
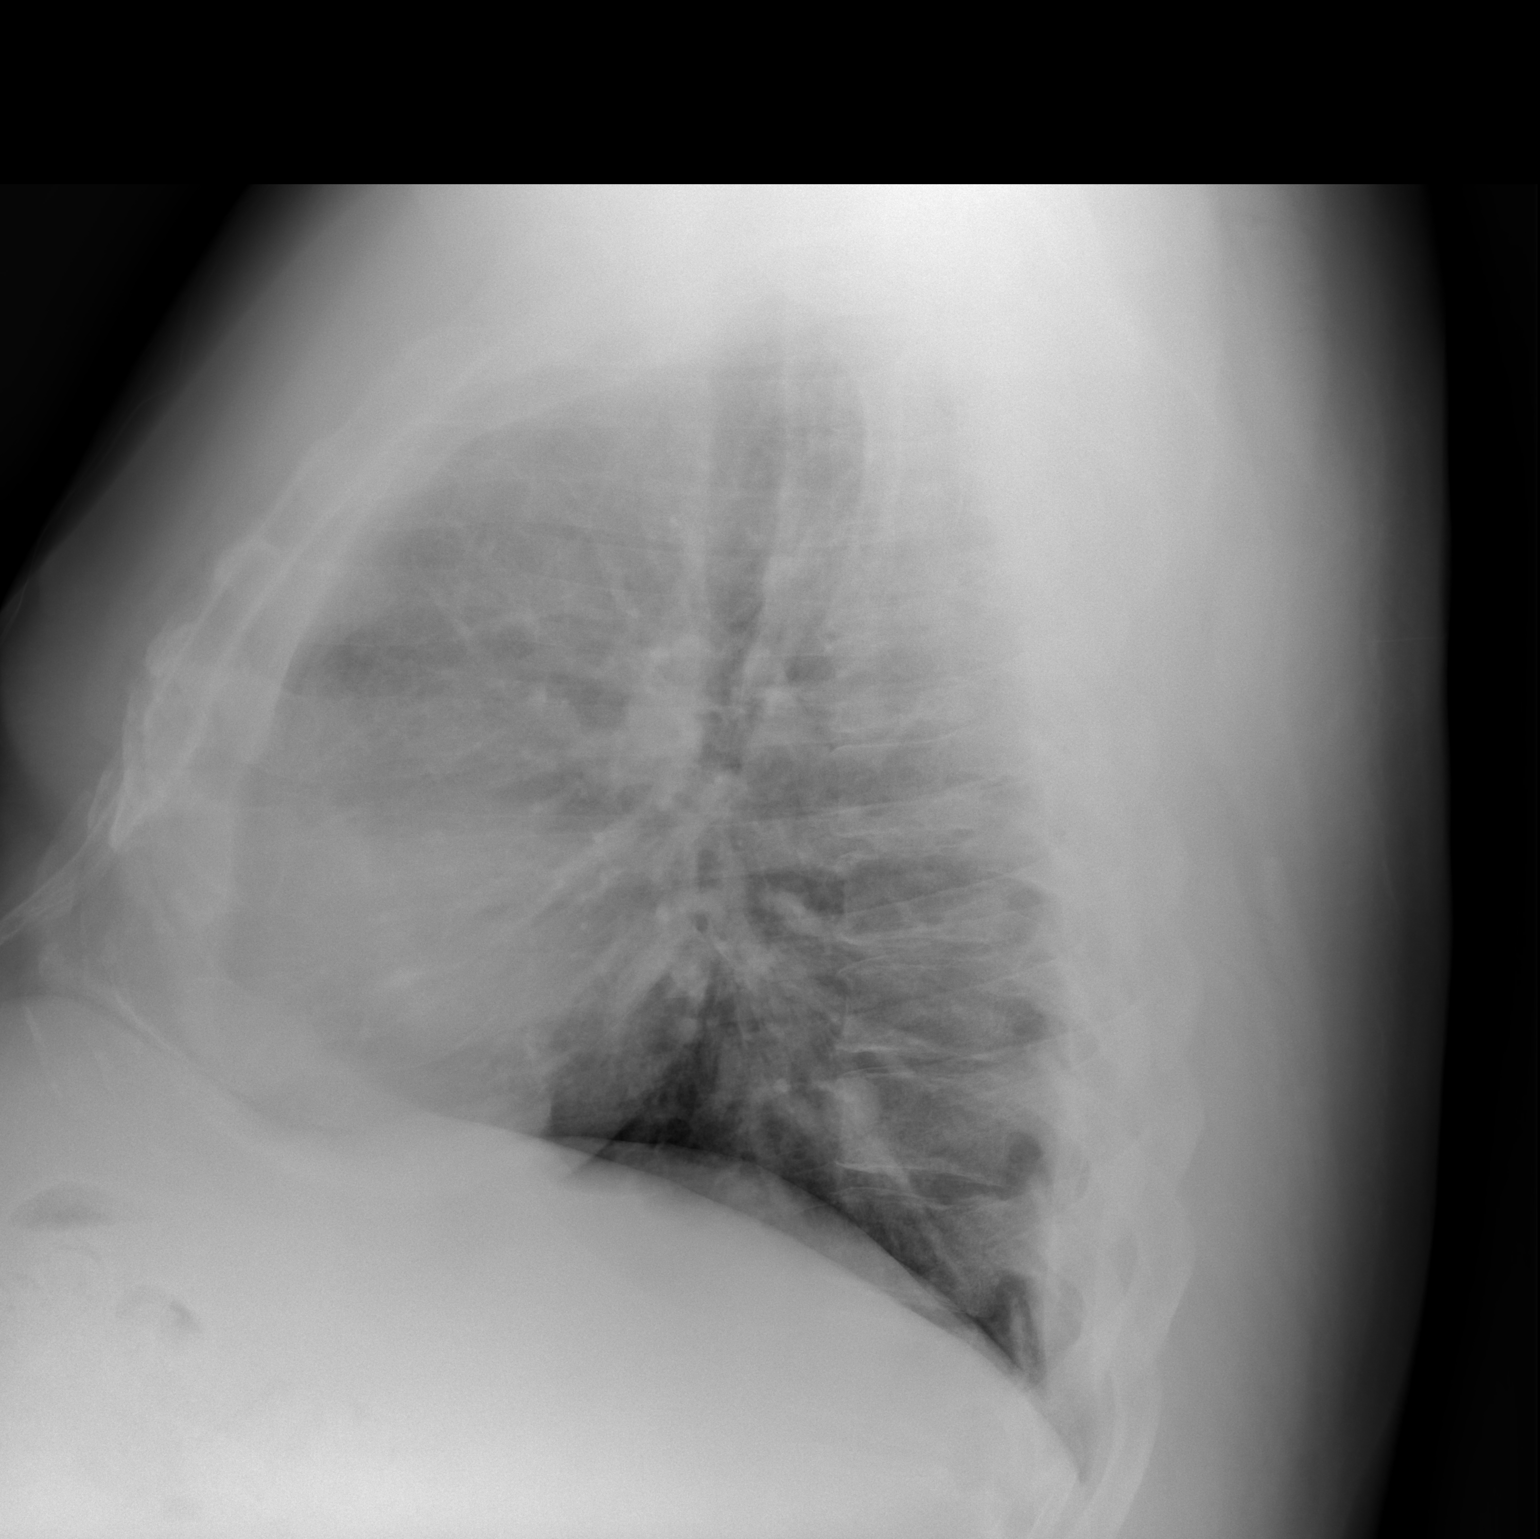

[2 of 2 positions shown; findings below may reference images not displayed]

FINDINGS: There is unchanged left lower lobe pulmonary nodule.
Lungs otherwise clear. There is mild cardiomegaly.  No pleural
effusion or consolidative process.
IMPRESSION: 1.  No acute finding.
2.  Unchanged left lower lobe of the pulmonary nodule since 6992
consistent with a benign lesion.
3.  Mild cardiomegaly.

## 2012-01-01 IMAGING — CR DG CHEST 1V PORT
1 series · 1 of 1 positions shown · non-contrast
Comparison: 02/20/2010.

CLINICAL DATA: Line placement

PORTABLE CHEST - 1 VIEW

[view not recorded]
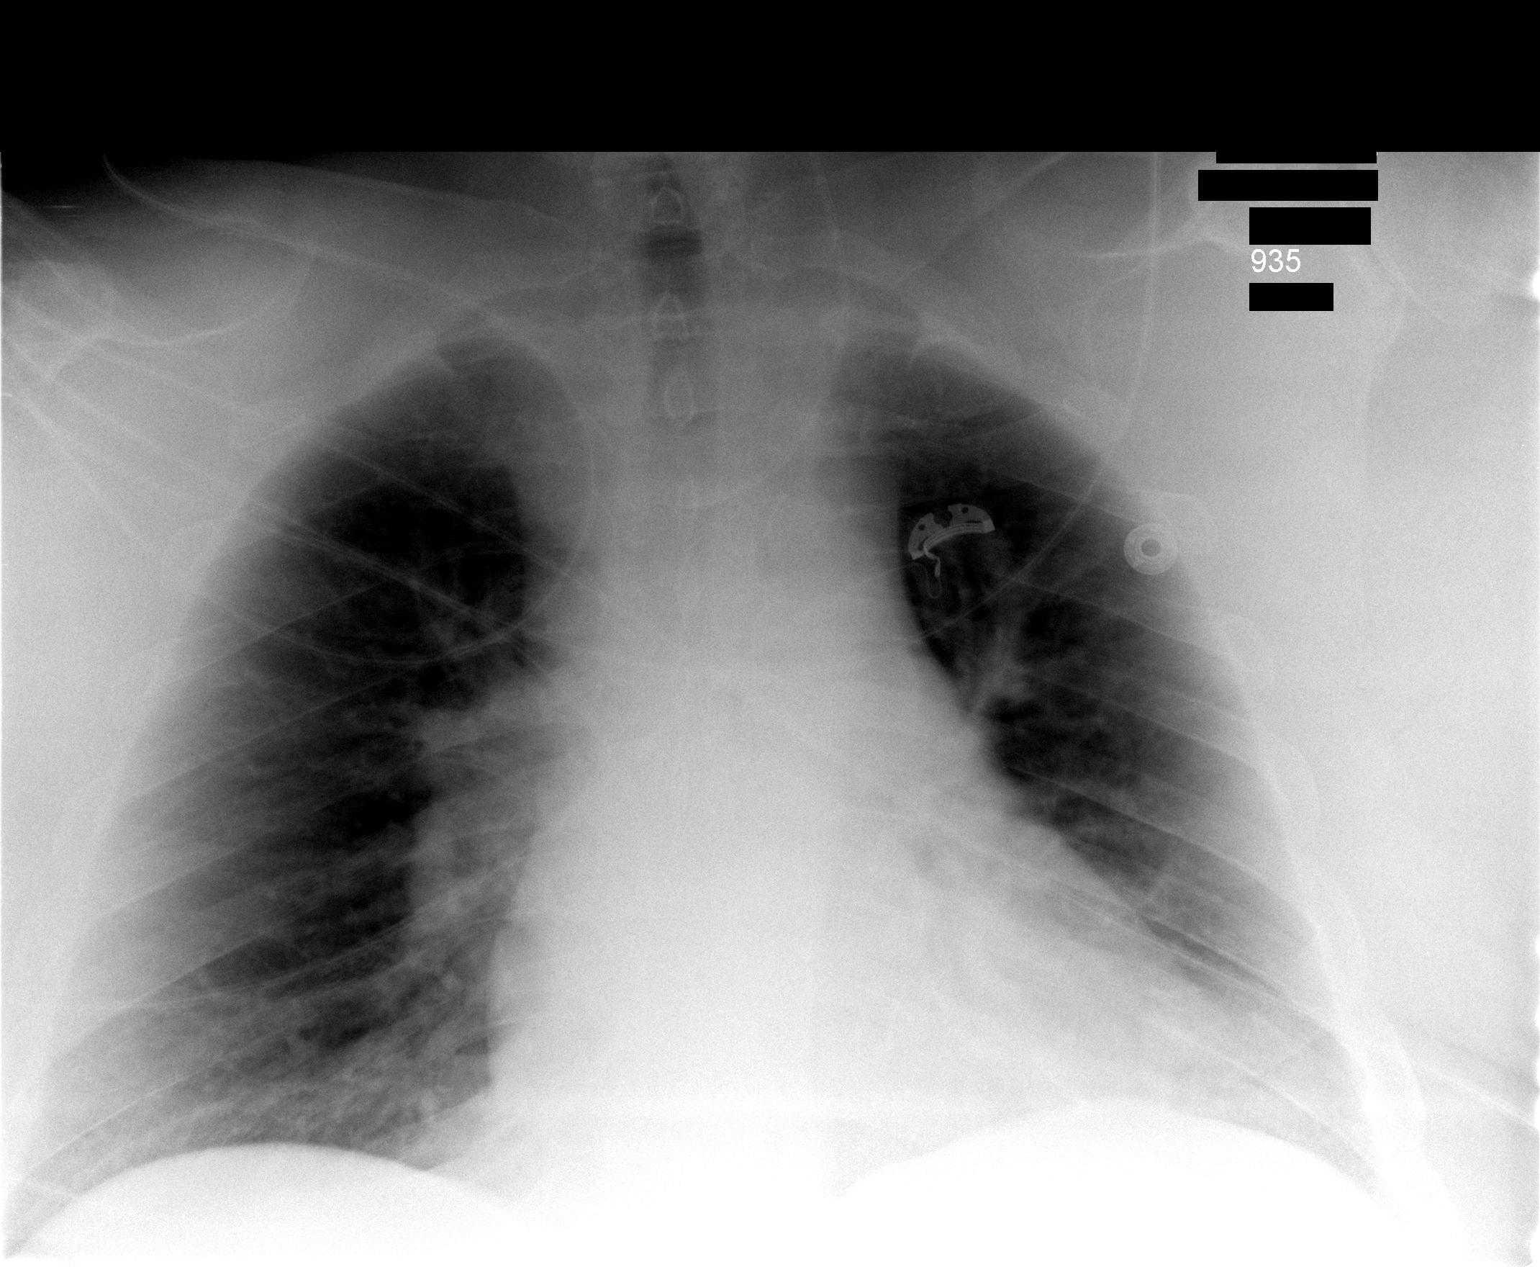

[1 of 1 positions shown; findings below may reference images not displayed]

FINDINGS: Left jugular central venous catheter is in the distal
left innominate vein. No pneumothorax.

Cardiomegaly.  Moderate pulmonary vascular congestion without
edema.  Cervical plate screw fusion hardware.
IMPRESSION: Specifically, the CVC is in the distal left innominate vein region.
No pneumothorax.

## 2012-01-01 IMAGING — CR DG CHEST 1V PORT
1 series · 1 of 1 positions shown · non-contrast
Comparison: Chest 01/09/2010.

CLINICAL DATA: Avascular necrosis right hip.  Congestive heart
failure.

PORTABLE CHEST - 1 VIEW

[view not recorded]
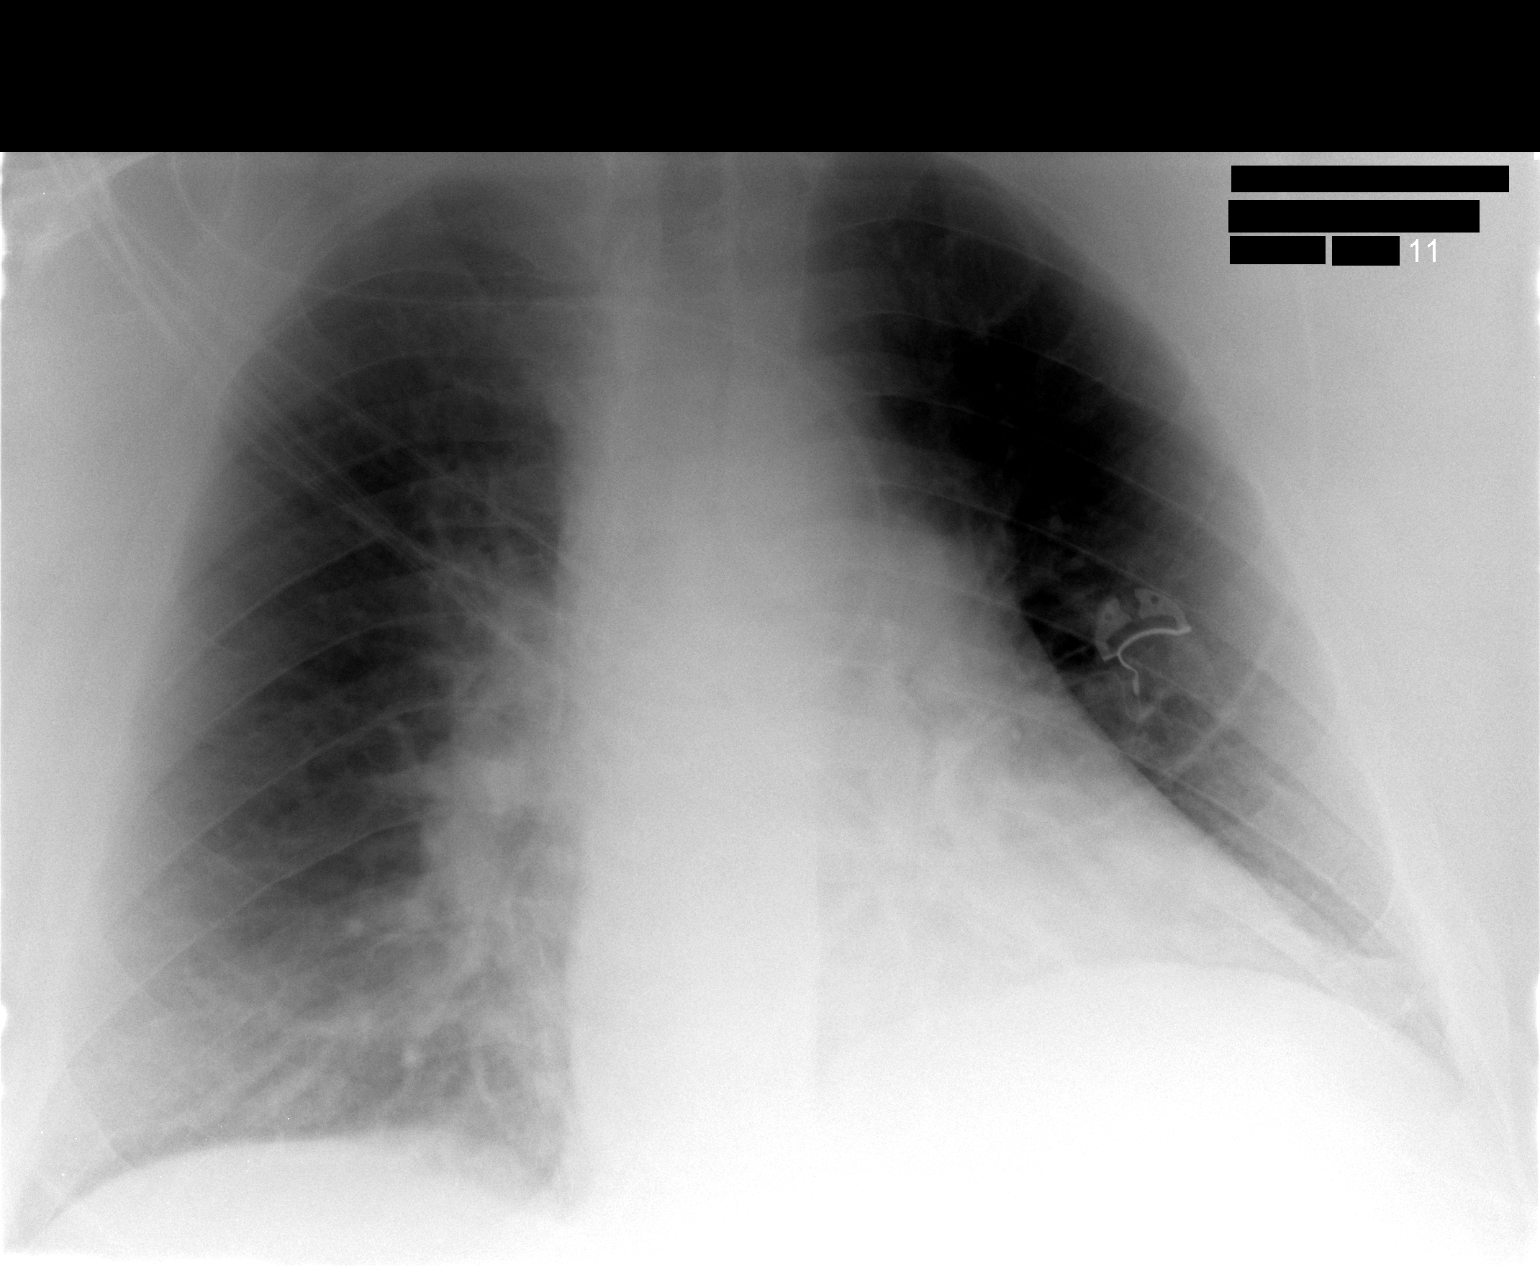

[1 of 1 positions shown; findings below may reference images not displayed]

FINDINGS: There is cardiomegaly and pulmonary vascular congestion
without frank edema.  No consolidative process.  Left lower lobe
pulmonary nodule again noted.
IMPRESSION: Cardiomegaly and pulmonary vascular congestion.

## 2014-04-13 ENCOUNTER — Other Ambulatory Visit (HOSPITAL_COMMUNITY): Payer: Self-pay | Admitting: Orthopaedic Surgery

## 2014-04-14 ENCOUNTER — Other Ambulatory Visit: Payer: Self-pay | Admitting: Urology

## 2014-04-14 ENCOUNTER — Encounter (HOSPITAL_COMMUNITY): Payer: Self-pay | Admitting: Pharmacy Technician

## 2014-04-17 ENCOUNTER — Inpatient Hospital Stay (HOSPITAL_COMMUNITY): Admission: RE | Admit: 2014-04-17 | Payer: 59 | Source: Ambulatory Visit

## 2014-04-17 ENCOUNTER — Encounter (HOSPITAL_COMMUNITY): Payer: Self-pay | Admitting: *Deleted

## 2014-04-17 MED ORDER — DEXTROSE 5 % IV SOLN
3.0000 g | INTRAVENOUS | Status: AC
  Administered 2014-04-18: 3 g via INTRAVENOUS
  Administered 2014-04-18: 2 g via INTRAVENOUS
  Filled 2014-04-17: qty 3000

## 2014-04-17 NOTE — Progress Notes (Signed)
Pt denies SOB, chest pain and is under the care of Dr. Clinton Quant ( cardiology) at [facility]. Pt stated that a chest x ray and EKG were done within the last year; results requested. Pt made aware to stop fish oil and GLUCOSAMINE.

## 2014-04-17 NOTE — H&P (Signed)
History of Present Illness   Jack Barry was here to follow up for his stricture. He had a lot of cystitis and I wanted to make certain his irritative symptoms settled down. He said at first when the catheter came out he had a lot of frequency, but now he can hold it for a few hours and he says his flow is great.  He did not void but was bladder scanned for 154 mL.   He is having surgery next Tuesday. His is on my list to put the catheter in around 11 a.m. at [facility].  Review of systems: No change in bowel or neurologic status.   His stricture and flow symptoms and frequency are all improved.    Past Medical History Problems  1. History of  Anxiety (Symptom) 300.00 2. History of  Diabetes Mellitus 250.00 3. History of  Hypercholesterolemia 272.0 4. History of  Hypertension 401.9  Surgical History Problems  1. History of  Cystoscopy For Urethral Stricture 2. History of  Cystoscopy For Urethral Stricture 3. History of  Neck Surgery 4. History of  Shoulder Surgery 5. History of  Tonsillectomy  Current Meds 1. HTN Medication; Status: ACTIVE  Allergies Medication  1. No Known Drug Allergies  Family History Problems  1. Maternal history of  Breast Cancer V16.3 Category: Maternal history of; Status: Active 2. Paternal history of  Death In The Family Father father passed @ age 66heart attack; Category: Paternal history of; Status: Active 3. Paternal grandmother's history of  Diabetes Mellitus V18.0 Category: Paternal grandmother's history of; Status: Active 4. Family history of  Family Health Status Number Of Children 1 son1 daughter; Category: Family history of; Status: Active 5. Paternal history of  Heart Disease V17.49 Category: Paternal history of; Status: Active  Social History Problems  1. Caffeine Use 2 drinks daily; Status: Active 2. Marital History - Currently Married Status: Active 3. Occupation: Psychologist, forensic; Status: Active 4. History of   Tobacco Use V15.82 smoked 2 packs dailyquit smoking 10 years agosmoked for 20 years; Category: History  of; Status: Resolved Denied  5. History of  Alcohol Use Category: History of; Status: Denied  Assessment Assessed  1. 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 Urinary Tract Infection 599.0  Plan   Follow up next week to place catheter 11 a.m. This will be placed on my schedule.  Need cath for hip surgery  After a thorough review of the management options for the patient's condition the patient  elected to proceed with surgical therapy as noted above. We have discussed the potential benefits and risks of the procedure, side effects of the proposed treatment, the likelihood of the patient achieving the goals of the procedure, and any potential problems that might occur during the procedure or recuperation. Informed consent has been obtained.

## 2014-04-18 ENCOUNTER — Encounter (HOSPITAL_COMMUNITY): Payer: 59 | Admitting: Anesthesiology

## 2014-04-18 ENCOUNTER — Inpatient Hospital Stay (HOSPITAL_COMMUNITY)
Admission: RE | Admit: 2014-04-18 | Discharge: 2014-04-24 | DRG: 467 | Disposition: A | Discharge status: Home Health | Payer: 59 | Source: Ambulatory Visit | Attending: Orthopaedic Surgery | Admitting: Orthopaedic Surgery

## 2014-04-18 ENCOUNTER — Encounter (HOSPITAL_COMMUNITY)
Admission: RE | Disposition: A | Discharge status: Home Health | Payer: Self-pay | Source: Ambulatory Visit | Attending: Orthopaedic Surgery

## 2014-04-18 ENCOUNTER — Ambulatory Visit (HOSPITAL_COMMUNITY): Payer: 59 | Admitting: Anesthesiology

## 2014-04-18 ENCOUNTER — Ambulatory Visit (HOSPITAL_COMMUNITY): Payer: 59

## 2014-04-18 ENCOUNTER — Inpatient Hospital Stay (HOSPITAL_COMMUNITY): Payer: 59

## 2014-04-18 ENCOUNTER — Encounter (HOSPITAL_COMMUNITY): Payer: Self-pay | Admitting: *Deleted

## 2014-04-18 DIAGNOSIS — Z6841 Body Mass Index (BMI) 40.0 and over, adult: Secondary | ICD-10-CM

## 2014-04-18 DIAGNOSIS — T84498A Other mechanical complication of other internal orthopedic devices, implants and grafts, initial encounter: Principal | ICD-10-CM | POA: Diagnosis present

## 2014-04-18 DIAGNOSIS — Z87891 Personal history of nicotine dependence: Secondary | ICD-10-CM

## 2014-04-18 DIAGNOSIS — Z833 Family history of diabetes mellitus: Secondary | ICD-10-CM

## 2014-04-18 DIAGNOSIS — Z96619 Presence of unspecified artificial shoulder joint: Secondary | ICD-10-CM

## 2014-04-18 DIAGNOSIS — Z8249 Family history of ischemic heart disease and other diseases of the circulatory system: Secondary | ICD-10-CM

## 2014-04-18 DIAGNOSIS — N189 Chronic kidney disease, unspecified: Secondary | ICD-10-CM | POA: Diagnosis present

## 2014-04-18 DIAGNOSIS — Y831 Surgical operation with implant of artificial internal device as the cause of abnormal reaction of the patient, or of later complication, without mention of misadventure at the time of the procedure: Secondary | ICD-10-CM | POA: Diagnosis present

## 2014-04-18 DIAGNOSIS — I129 Hypertensive chronic kidney disease with stage 1 through stage 4 chronic kidney disease, or unspecified chronic kidney disease: Secondary | ICD-10-CM | POA: Diagnosis present

## 2014-04-18 DIAGNOSIS — G4733 Obstructive sleep apnea (adult) (pediatric): Secondary | ICD-10-CM | POA: Diagnosis present

## 2014-04-18 DIAGNOSIS — Q5564 Hidden penis: Secondary | ICD-10-CM

## 2014-04-18 DIAGNOSIS — T8459XA Infection and inflammatory reaction due to other internal joint prosthesis, initial encounter: Secondary | ICD-10-CM

## 2014-04-18 DIAGNOSIS — T84038A Mechanical loosening of other internal prosthetic joint, initial encounter: Secondary | ICD-10-CM

## 2014-04-18 DIAGNOSIS — Z794 Long term (current) use of insulin: Secondary | ICD-10-CM

## 2014-04-18 DIAGNOSIS — N35919 Unspecified urethral stricture, male, unspecified site: Secondary | ICD-10-CM

## 2014-04-18 DIAGNOSIS — Z96649 Presence of unspecified artificial hip joint: Secondary | ICD-10-CM

## 2014-04-18 DIAGNOSIS — E119 Type 2 diabetes mellitus without complications: Secondary | ICD-10-CM | POA: Diagnosis present

## 2014-04-18 DIAGNOSIS — Z87440 Personal history of urinary (tract) infections: Secondary | ICD-10-CM

## 2014-04-18 DIAGNOSIS — D62 Acute posthemorrhagic anemia: Secondary | ICD-10-CM | POA: Diagnosis not present

## 2014-04-18 DIAGNOSIS — Z87898 Personal history of other specified conditions: Secondary | ICD-10-CM

## 2014-04-18 HISTORY — DX: Obstructive sleep apnea (adult) (pediatric): G47.33

## 2014-04-18 HISTORY — DX: Gastro-esophageal reflux disease without esophagitis: K21.9

## 2014-04-18 HISTORY — DX: Unspecified atrial fibrillation: I48.91

## 2014-04-18 HISTORY — PX: BALLOON DILATION: SHX5330

## 2014-04-18 HISTORY — PX: CYSTOSCOPY WITH RETROGRADE URETHROGRAM: SHX6309

## 2014-04-18 HISTORY — DX: Pure hypercholesterolemia, unspecified: E78.00

## 2014-04-18 HISTORY — DX: Type 1 diabetes mellitus without complications: E10.9

## 2014-04-18 HISTORY — DX: Depression, unspecified: F32.A

## 2014-04-18 HISTORY — DX: Anxiety disorder, unspecified: F41.9

## 2014-04-18 HISTORY — DX: Major depressive disorder, single episode, unspecified: F32.9

## 2014-04-18 HISTORY — DX: Unspecified chronic bronchitis: J42

## 2014-04-18 HISTORY — DX: Dependence on other enabling machines and devices: Z99.89

## 2014-04-18 HISTORY — DX: Chronic kidney disease, unspecified: N18.9

## 2014-04-18 HISTORY — DX: Essential (primary) hypertension: I10

## 2014-04-18 HISTORY — DX: Pneumonia, unspecified organism: J18.9

## 2014-04-18 HISTORY — PX: EXCISIONAL TOTAL HIP ARTHROPLASTY WITH ANTIBIOTIC SPACERS: SHX5826

## 2014-04-18 HISTORY — DX: Cellulitis, unspecified: L03.90

## 2014-04-18 HISTORY — DX: Urinary tract infection, site not specified: N39.0

## 2014-04-18 LAB — CBC
HCT: 43.1 % (ref 39.0–52.0)
Hemoglobin: 14.2 g/dL (ref 13.0–17.0)
MCH: 28.8 pg (ref 26.0–34.0)
MCHC: 32.9 g/dL (ref 30.0–36.0)
MCV: 87.4 fL (ref 78.0–100.0)
PLATELETS: 323 10*3/uL (ref 150–400)
RBC: 4.93 MIL/uL (ref 4.22–5.81)
RDW: 15.3 % (ref 11.5–15.5)
WBC: 8.8 10*3/uL (ref 4.0–10.5)

## 2014-04-18 LAB — PROTIME-INR
INR: 1.11 (ref 0.00–1.49)
PROTHROMBIN TIME: 14.1 s (ref 11.6–15.2)

## 2014-04-18 LAB — POCT I-STAT 4, (NA,K, GLUC, HGB,HCT)
Glucose, Bld: 143 mg/dL — ABNORMAL HIGH (ref 70–99)
HEMATOCRIT: 35 % — AB (ref 39.0–52.0)
HEMOGLOBIN: 11.9 g/dL — AB (ref 13.0–17.0)
POTASSIUM: 4.2 meq/L (ref 3.7–5.3)
Sodium: 140 mEq/L (ref 137–147)

## 2014-04-18 LAB — BASIC METABOLIC PANEL
BUN: 18 mg/dL (ref 6–23)
CALCIUM: 9.4 mg/dL (ref 8.4–10.5)
CO2: 28 mEq/L (ref 19–32)
Chloride: 98 mEq/L (ref 96–112)
Creatinine, Ser: 1.08 mg/dL (ref 0.50–1.35)
GFR calc Af Amer: 87 mL/min — ABNORMAL LOW (ref >90)
GFR calc non Af Amer: 75 mL/min — ABNORMAL LOW (ref >90)
GLUCOSE: 130 mg/dL — AB (ref 70–99)
Potassium: 4.3 mEq/L (ref 3.7–5.3)
SODIUM: 137 meq/L (ref 137–147)

## 2014-04-18 LAB — GLUCOSE, CAPILLARY
Glucose-Capillary: 115 mg/dL — ABNORMAL HIGH (ref 70–99)
Glucose-Capillary: 104 mg/dL — ABNORMAL HIGH (ref 70–99)

## 2014-04-18 LAB — APTT: APTT: 35 s (ref 24–37)

## 2014-04-18 LAB — SURGICAL PCR SCREEN
MRSA, PCR: NEGATIVE
STAPHYLOCOCCUS AUREUS: NEGATIVE

## 2014-04-18 SURGERY — EXCISIONAL TOTAL HIP ARTHROPLASTY WITH ANTIBIOTIC SPACERS
Anesthesia: General | Site: Hip | Laterality: Right

## 2014-04-18 MED ORDER — BACITRACIN ZINC 500 UNIT/GM EX OINT
TOPICAL_OINTMENT | CUTANEOUS | Status: AC
Start: 2014-04-18 — End: 2014-04-18
  Filled 2014-04-18: qty 15

## 2014-04-18 MED ORDER — VANCOMYCIN HCL 1000 MG IV SOLR
INTRAVENOUS | Status: DC | PRN
  Administered 2014-04-18: 2 g

## 2014-04-18 MED ORDER — OXYCODONE HCL ER 40 MG PO T12A
40.0000 mg | EXTENDED_RELEASE_TABLET | Freq: Two times a day (BID) | ORAL | Status: DC
  Administered 2014-04-18 – 2014-04-24 (×12): 40 mg via ORAL
  Filled 2014-04-18 (×12): qty 2

## 2014-04-18 MED ORDER — IOHEXOL 300 MG/ML  SOLN
INTRAMUSCULAR | Status: DC | PRN
  Administered 2014-04-18: 50 mL via URETHRAL

## 2014-04-18 MED ORDER — STERILE WATER FOR IRRIGATION IR SOLN
Status: DC | PRN
Start: 2014-04-18 — End: 2014-04-18
  Administered 2014-04-18: 1000 mL

## 2014-04-18 MED ORDER — VANCOMYCIN HCL 10 G IV SOLR
1500.0000 mg | INTRAVENOUS | Status: AC
  Administered 2014-04-18: 1500 mg via INTRAVENOUS
  Filled 2014-04-18: qty 1500

## 2014-04-18 MED ORDER — CEFAZOLIN SODIUM-DEXTROSE 2-3 GM-% IV SOLR
INTRAVENOUS | Status: AC
Start: 2014-04-18 — End: 2014-04-18
  Filled 2014-04-18: qty 50

## 2014-04-18 MED ORDER — VECURONIUM BROMIDE 10 MG IV SOLR
INTRAVENOUS | Status: DC | PRN
  Administered 2014-04-18: 10 mg via INTRAVENOUS

## 2014-04-18 MED ORDER — EPHEDRINE SULFATE 50 MG/ML IJ SOLN
INTRAMUSCULAR | Status: DC | PRN
  Administered 2014-04-18 (×2): 10 mg via INTRAVENOUS
  Administered 2014-04-18 (×2): 5 mg via INTRAVENOUS

## 2014-04-18 MED ORDER — GLYCOPYRROLATE 0.2 MG/ML IJ SOLN
INTRAMUSCULAR | Status: AC
  Filled 2014-04-18: qty 3

## 2014-04-18 MED ORDER — FENTANYL CITRATE 0.05 MG/ML IJ SOLN
INTRAMUSCULAR | Status: DC | PRN
  Administered 2014-04-18 (×2): 50 ug via INTRAVENOUS
  Administered 2014-04-18: 100 ug via INTRAVENOUS
  Administered 2014-04-18: 50 ug via INTRAVENOUS
  Administered 2014-04-18: 100 ug via INTRAVENOUS

## 2014-04-18 MED ORDER — PROPOFOL 10 MG/ML IV BOLUS
INTRAVENOUS | Status: DC | PRN
  Administered 2014-04-18: 200 mg via INTRAVENOUS

## 2014-04-18 MED ORDER — TESTOSTERONE CYPIONATE 200 MG/ML IM SOLN: 200.0000 mg | INTRAMUSCULAR | Status: DC

## 2014-04-18 MED ORDER — TOBRAMYCIN SULFATE 1.2 G IJ SOLR
INTRAMUSCULAR | Status: DC | PRN
  Administered 2014-04-18: 3.6 g

## 2014-04-18 MED ORDER — VANCOMYCIN HCL 1000 MG IV SOLR
INTRAVENOUS | Status: AC
  Filled 2014-04-18: qty 1000

## 2014-04-18 MED ORDER — HYDROMORPHONE HCL PF 1 MG/ML IJ SOLN
1.0000 mg | INTRAMUSCULAR | Status: DC | PRN
  Administered 2014-04-19 (×2): 1 mg via INTRAVENOUS
  Filled 2014-04-18 (×2): qty 1

## 2014-04-18 MED ORDER — HYDROCODONE-ACETAMINOPHEN 5-325 MG PO TABS
1.0000 | ORAL_TABLET | ORAL | Status: DC | PRN
  Administered 2014-04-20 – 2014-04-21 (×3): 2 via ORAL
  Administered 2014-04-21: 1 via ORAL
  Administered 2014-04-21 – 2014-04-24 (×4): 2 via ORAL
  Filled 2014-04-18 (×6): qty 2
  Filled 2014-04-18: qty 1
  Filled 2014-04-18 (×3): qty 2

## 2014-04-18 MED ORDER — OXYCODONE HCL 5 MG PO TABS
ORAL_TABLET | ORAL | Status: AC
  Filled 2014-04-18: qty 2

## 2014-04-18 MED ORDER — OXYCODONE HCL 5 MG PO TABS: 5.0000 mg | ORAL_TABLET | Freq: Once | ORAL | Status: DC | PRN

## 2014-04-18 MED ORDER — LACTATED RINGERS IV SOLN
INTRAVENOUS | Status: DC | PRN
  Administered 2014-04-18 (×2): via INTRAVENOUS

## 2014-04-18 MED ORDER — FERROUS SULFATE 325 (65 FE) MG PO TABS
325.0000 mg | ORAL_TABLET | Freq: Three times a day (TID) | ORAL | Status: DC
  Administered 2014-04-19 – 2014-04-23 (×13): 325 mg via ORAL
  Filled 2014-04-18 (×19): qty 1

## 2014-04-18 MED ORDER — CITALOPRAM HYDROBROMIDE 40 MG PO TABS
40.0000 mg | ORAL_TABLET | Freq: Every day | ORAL | Status: DC
  Administered 2014-04-19 – 2014-04-24 (×6): 40 mg via ORAL
  Filled 2014-04-18 (×6): qty 1

## 2014-04-18 MED ORDER — DEXTROSE 5 % IV SOLN
2.0000 g | Freq: Three times a day (TID) | INTRAVENOUS | Status: DC
  Administered 2014-04-19 – 2014-04-24 (×17): 2 g via INTRAVENOUS
  Filled 2014-04-18 (×19): qty 2

## 2014-04-18 MED ORDER — TIZANIDINE HCL 4 MG PO TABS
4.0000 mg | ORAL_TABLET | Freq: Three times a day (TID) | ORAL | Status: DC | PRN
  Filled 2014-04-18 (×2): qty 1

## 2014-04-18 MED ORDER — SOTALOL HCL 120 MG PO TABS
120.0000 mg | ORAL_TABLET | Freq: Two times a day (BID) | ORAL | Status: DC
  Administered 2014-04-19 – 2014-04-24 (×12): 120 mg via ORAL
  Filled 2014-04-18 (×14): qty 1

## 2014-04-18 MED ORDER — MUPIROCIN 2 % EX OINT
TOPICAL_OINTMENT | CUTANEOUS | Status: AC
  Administered 2014-04-18: 1 via NASAL
  Filled 2014-04-18: qty 22

## 2014-04-18 MED ORDER — TOBRAMYCIN SULFATE 1.2 G IJ SOLR
INTRAMUSCULAR | Status: AC
  Filled 2014-04-18: qty 3.6

## 2014-04-18 MED ORDER — ONDANSETRON HCL 4 MG/2ML IJ SOLN
INTRAMUSCULAR | Status: AC
  Filled 2014-04-18: qty 2

## 2014-04-18 MED ORDER — PROPOFOL 10 MG/ML IV BOLUS
INTRAVENOUS | Status: AC
  Filled 2014-04-18: qty 20

## 2014-04-18 MED ORDER — ROCURONIUM BROMIDE 50 MG/5ML IV SOLN
INTRAVENOUS | Status: AC
Start: 2014-04-18 — End: 2014-04-18
  Filled 2014-04-18: qty 1

## 2014-04-18 MED ORDER — HYDROMORPHONE HCL PF 1 MG/ML IJ SOLN
0.2500 mg | INTRAMUSCULAR | Status: DC | PRN
  Administered 2014-04-18 (×4): 0.5 mg via INTRAVENOUS

## 2014-04-18 MED ORDER — LORAZEPAM 0.5 MG PO TABS
0.5000 mg | ORAL_TABLET | Freq: Three times a day (TID) | ORAL | Status: DC | PRN
  Administered 2014-04-20 – 2014-04-23 (×6): 0.5 mg via ORAL
  Filled 2014-04-18 (×6): qty 1

## 2014-04-18 MED ORDER — INSULIN DETEMIR 100 UNIT/ML ~~LOC~~ SOLN
50.0000 [IU] | Freq: Every day | SUBCUTANEOUS | Status: DC
  Administered 2014-04-19 – 2014-04-24 (×6): 50 [IU] via SUBCUTANEOUS
  Filled 2014-04-18 (×6): qty 0.5

## 2014-04-18 MED ORDER — METOCLOPRAMIDE HCL 5 MG/ML IJ SOLN: 5.0000 mg | Freq: Three times a day (TID) | INTRAMUSCULAR | Status: DC | PRN

## 2014-04-18 MED ORDER — ALBUMIN HUMAN 5 % IV SOLN
INTRAVENOUS | Status: DC | PRN
  Administered 2014-04-18 (×2): via INTRAVENOUS

## 2014-04-18 MED ORDER — NEOSTIGMINE METHYLSULFATE 10 MG/10ML IV SOLN
INTRAVENOUS | Status: DC | PRN
  Administered 2014-04-18: 4 mg via INTRAVENOUS

## 2014-04-18 MED ORDER — SODIUM CHLORIDE 0.9 % IR SOLN
Status: DC | PRN
  Administered 2014-04-18 (×2): 1000 mL

## 2014-04-18 MED ORDER — FENTANYL CITRATE 0.05 MG/ML IJ SOLN
INTRAMUSCULAR | Status: AC
  Filled 2014-04-18: qty 5

## 2014-04-18 MED ORDER — ONDANSETRON HCL 4 MG/2ML IJ SOLN: 4.0000 mg | Freq: Once | INTRAMUSCULAR | Status: DC | PRN

## 2014-04-18 MED ORDER — DEXTROSE 5 % IV SOLN
2.0000 g | INTRAVENOUS | Status: AC
  Administered 2014-04-18: 2 g via INTRAVENOUS
  Filled 2014-04-18: qty 2

## 2014-04-18 MED ORDER — HYDROMORPHONE HCL PF 1 MG/ML IJ SOLN
INTRAMUSCULAR | Status: AC
  Filled 2014-04-18: qty 1

## 2014-04-18 MED ORDER — MEPERIDINE HCL 25 MG/ML IJ SOLN: 6.2500 mg | INTRAMUSCULAR | Status: DC | PRN

## 2014-04-18 MED ORDER — NITROGLYCERIN 0.2 MG/HR TD PT24
0.2000 mg | MEDICATED_PATCH | Freq: Every day | TRANSDERMAL | Status: DC
  Administered 2014-04-19 – 2014-04-24 (×6): 0.2 mg via TRANSDERMAL
  Filled 2014-04-18 (×6): qty 1

## 2014-04-18 MED ORDER — TOBRAMYCIN SULFATE 1.2 G IJ SOLR
7.2000 g | INTRAMUSCULAR | Status: DC
  Filled 2014-04-18: qty 7.2

## 2014-04-18 MED ORDER — ASPIRIN EC 325 MG PO TBEC
325.0000 mg | DELAYED_RELEASE_TABLET | Freq: Two times a day (BID) | ORAL | Status: DC
  Administered 2014-04-19 – 2014-04-24 (×11): 325 mg via ORAL
  Filled 2014-04-18 (×13): qty 1

## 2014-04-18 MED ORDER — AMLODIPINE BESYLATE 5 MG PO TABS
5.0000 mg | ORAL_TABLET | Freq: Every day | ORAL | Status: DC
  Administered 2014-04-19 – 2014-04-24 (×6): 5 mg via ORAL
  Filled 2014-04-18 (×6): qty 1

## 2014-04-18 MED ORDER — BUPROPION HCL ER (XL) 150 MG PO TB24
150.0000 mg | ORAL_TABLET | Freq: Every day | ORAL | Status: DC
  Administered 2014-04-19 – 2014-04-24 (×6): 150 mg via ORAL
  Filled 2014-04-18 (×6): qty 1

## 2014-04-18 MED ORDER — LIDOCAINE HCL 2 % EX GEL
CUTANEOUS | Status: AC
Start: 2014-04-18 — End: 2014-04-18
  Filled 2014-04-18: qty 20

## 2014-04-18 MED ORDER — ROCURONIUM BROMIDE 100 MG/10ML IV SOLN
INTRAVENOUS | Status: DC | PRN
  Administered 2014-04-18 (×2): 10 mg via INTRAVENOUS
  Administered 2014-04-18: 40 mg via INTRAVENOUS
  Administered 2014-04-18: 20 mg via INTRAVENOUS

## 2014-04-18 MED ORDER — LACTATED RINGERS IV SOLN
INTRAVENOUS | Status: DC
  Administered 2014-04-18: 14:00:00 via INTRAVENOUS

## 2014-04-18 MED ORDER — ONDANSETRON HCL 4 MG/2ML IJ SOLN
INTRAMUSCULAR | Status: DC | PRN
  Administered 2014-04-18 (×2): 4 mg via INTRAVENOUS

## 2014-04-18 MED ORDER — SODIUM CHLORIDE 0.9 % IJ SOLN
INTRAMUSCULAR | Status: AC
  Filled 2014-04-18: qty 10

## 2014-04-18 MED ORDER — TOBRAMYCIN SULFATE 1.2 G IJ SOLR
8.4000 g | INTRAMUSCULAR | Status: DC
  Filled 2014-04-18: qty 8.4

## 2014-04-18 MED ORDER — METHYLENE BLUE 1 % INJ SOLN
INTRAMUSCULAR | Status: AC
  Filled 2014-04-18: qty 10

## 2014-04-18 MED ORDER — POLYETHYLENE GLYCOL 3350 17 G PO PACK
17.0000 g | PACK | Freq: Every day | ORAL | Status: DC | PRN
  Filled 2014-04-18: qty 1

## 2014-04-18 MED ORDER — GEMFIBROZIL 600 MG PO TABS
600.0000 mg | ORAL_TABLET | Freq: Two times a day (BID) | ORAL | Status: DC
  Administered 2014-04-19 – 2014-04-24 (×11): 600 mg via ORAL
  Filled 2014-04-18 (×13): qty 1

## 2014-04-18 MED ORDER — PHENYLEPHRINE HCL 10 MG/ML IJ SOLN
10.0000 mg | INTRAVENOUS | Status: DC | PRN
  Administered 2014-04-18: 25 ug/min via INTRAVENOUS

## 2014-04-18 MED ORDER — NITROGLYCERIN 0.2 MG/HR TD PT24: 0.2000 mg | MEDICATED_PATCH | Freq: Every day | TRANSDERMAL | Status: DC

## 2014-04-18 MED ORDER — OXYCODONE HCL 5 MG PO TABS
5.0000 mg | ORAL_TABLET | ORAL | Status: DC | PRN
  Administered 2014-04-18: 10 mg via ORAL
  Administered 2014-04-18: 5 mg via ORAL
  Administered 2014-04-19 – 2014-04-22 (×8): 15 mg via ORAL
  Administered 2014-04-22 – 2014-04-24 (×8): 10 mg via ORAL
  Filled 2014-04-18: qty 2
  Filled 2014-04-18 (×3): qty 3
  Filled 2014-04-18 (×3): qty 2
  Filled 2014-04-18 (×2): qty 3
  Filled 2014-04-18: qty 1
  Filled 2014-04-18: qty 2
  Filled 2014-04-18 (×3): qty 3
  Filled 2014-04-18 (×2): qty 2
  Filled 2014-04-18 (×2): qty 3

## 2014-04-18 MED ORDER — STERILE WATER FOR INJECTION IJ SOLN
INTRAMUSCULAR | Status: AC
  Filled 2014-04-18: qty 10

## 2014-04-18 MED ORDER — MIDAZOLAM HCL 5 MG/5ML IJ SOLN
INTRAMUSCULAR | Status: DC | PRN
  Administered 2014-04-18: 2 mg via INTRAVENOUS

## 2014-04-18 MED ORDER — VANCOMYCIN HCL 10 G IV SOLR
1500.0000 mg | Freq: Two times a day (BID) | INTRAVENOUS | Status: DC
  Administered 2014-04-19 – 2014-04-20 (×3): 1500 mg via INTRAVENOUS
  Filled 2014-04-18 (×5): qty 1500

## 2014-04-18 MED ORDER — MIDAZOLAM HCL 2 MG/2ML IJ SOLN
INTRAMUSCULAR | Status: AC
  Filled 2014-04-18: qty 2

## 2014-04-18 MED ORDER — LACTATED RINGERS IV SOLN
INTRAVENOUS | Status: DC | PRN
  Administered 2014-04-18: 17:00:00 via INTRAVENOUS

## 2014-04-18 MED ORDER — DIPHENHYDRAMINE HCL 12.5 MG/5ML PO ELIX: 12.5000 mg | ORAL_SOLUTION | ORAL | Status: DC | PRN

## 2014-04-18 MED ORDER — DILTIAZEM HCL ER 180 MG PO CP24
180.0000 mg | ORAL_CAPSULE | Freq: Two times a day (BID) | ORAL | Status: DC
  Administered 2014-04-19 – 2014-04-24 (×12): 180 mg via ORAL
  Filled 2014-04-18 (×16): qty 1

## 2014-04-18 MED ORDER — LIDOCAINE HCL 2 % EX GEL
CUTANEOUS | Status: DC | PRN
Start: 2014-04-18 — End: 2014-04-18
  Administered 2014-04-18: 1 via URETHRAL

## 2014-04-18 MED ORDER — OXYCODONE HCL 5 MG/5ML PO SOLN: 5.0000 mg | Freq: Once | ORAL | Status: DC | PRN

## 2014-04-18 MED ORDER — ONDANSETRON HCL 4 MG/2ML IJ SOLN: 4.0000 mg | Freq: Four times a day (QID) | INTRAMUSCULAR | Status: DC | PRN

## 2014-04-18 MED ORDER — CARVEDILOL 6.25 MG PO TABS
6.2500 mg | ORAL_TABLET | Freq: Two times a day (BID) | ORAL | Status: DC
  Administered 2014-04-19 – 2014-04-24 (×11): 6.25 mg via ORAL
  Filled 2014-04-18 (×13): qty 1

## 2014-04-18 MED ORDER — DOCUSATE SODIUM 100 MG PO CAPS
100.0000 mg | ORAL_CAPSULE | Freq: Two times a day (BID) | ORAL | Status: DC
  Administered 2014-04-19 – 2014-04-24 (×12): 100 mg via ORAL
  Filled 2014-04-18 (×14): qty 1

## 2014-04-18 MED ORDER — BACITRACIN ZINC 500 UNIT/GM EX OINT
TOPICAL_OINTMENT | CUTANEOUS | Status: DC | PRN
  Administered 2014-04-18: 1 via TOPICAL

## 2014-04-18 MED ORDER — SUCCINYLCHOLINE CHLORIDE 20 MG/ML IJ SOLN
INTRAMUSCULAR | Status: DC | PRN
  Administered 2014-04-18: 140 mg via INTRAVENOUS

## 2014-04-18 MED ORDER — VANCOMYCIN HCL 1000 MG IV SOLR
2000.0000 mg | INTRAVENOUS | Status: DC
  Filled 2014-04-18: qty 2000

## 2014-04-18 MED ORDER — ONDANSETRON HCL 4 MG PO TABS: 4.0000 mg | ORAL_TABLET | Freq: Four times a day (QID) | ORAL | Status: DC | PRN

## 2014-04-18 MED ORDER — INSULIN ASPART 100 UNIT/ML ~~LOC~~ SOLN
10.0000 [IU] | Freq: Three times a day (TID) | SUBCUTANEOUS | Status: DC
  Administered 2014-04-19 – 2014-04-24 (×16): 10 [IU] via SUBCUTANEOUS

## 2014-04-18 MED ORDER — PANTOPRAZOLE SODIUM 40 MG PO TBEC
40.0000 mg | DELAYED_RELEASE_TABLET | Freq: Every day | ORAL | Status: DC
  Administered 2014-04-19 – 2014-04-24 (×6): 40 mg via ORAL
  Filled 2014-04-18 (×6): qty 1

## 2014-04-18 MED ORDER — SODIUM CHLORIDE 0.9 % IV SOLN
INTRAVENOUS | Status: DC
  Administered 2014-04-19: 75 mL/h via INTRAVENOUS
  Administered 2014-04-22: 09:00:00 via INTRAVENOUS

## 2014-04-18 MED ORDER — METOCLOPRAMIDE HCL 10 MG PO TABS: 5.0000 mg | ORAL_TABLET | Freq: Three times a day (TID) | ORAL | Status: DC | PRN

## 2014-04-18 MED ORDER — DEXAMETHASONE SODIUM PHOSPHATE 10 MG/ML IJ SOLN
INTRAMUSCULAR | Status: DC | PRN
  Administered 2014-04-18: 8 mg via INTRAVENOUS

## 2014-04-18 MED ORDER — GLYCOPYRROLATE 0.2 MG/ML IJ SOLN
INTRAMUSCULAR | Status: DC | PRN
  Administered 2014-04-18: 0.2 mg via INTRAVENOUS
  Administered 2014-04-18: 0.6 mg via INTRAVENOUS

## 2014-04-18 MED ORDER — NEOSTIGMINE METHYLSULFATE 10 MG/10ML IV SOLN
INTRAVENOUS | Status: AC
  Filled 2014-04-18: qty 1

## 2014-04-18 SURGICAL SUPPLY — 87 items
ADAPTER CATH URET PLST 4-6FR (CATHETERS) IMPLANT
ADPR CATH URET STRL DISP 4-6FR (CATHETERS)
APL SKNCLS STERI-STRIP NONHPOA (GAUZE/BANDAGES/DRESSINGS)
BAG URINE DRAINAGE (UROLOGICAL SUPPLIES) ×3 IMPLANT
BALL HIP ARTICU EZE 36 8.5 (Hips) IMPLANT
BALLN NEPHROSTOMY (BALLOONS) ×3
BALLOON NEPHROSTOMY (BALLOONS) IMPLANT
BENZOIN TINCTURE PRP APPL 2/3 (GAUZE/BANDAGES/DRESSINGS) IMPLANT
BLADE 10 SAFETY STRL DISP (BLADE) ×3 IMPLANT
BLADE SURG ROTATE 9660 (MISCELLANEOUS) IMPLANT
BOWL SMART MIX CTS (DISPOSABLE) ×2 IMPLANT
BRUSH FEMORAL CANAL (MISCELLANEOUS) ×2 IMPLANT
BUCKET BIOHAZARD WASTE 5 GAL (MISCELLANEOUS) ×2 IMPLANT
CATH FOLEY 2W COUNCIL 5CC 16FR (CATHETERS) ×2 IMPLANT
CATH FOLEY 2WAY SLVR  5CC 14FR (CATHETERS) ×1
CATH FOLEY 2WAY SLVR  5CC 16FR (CATHETERS)
CATH FOLEY 2WAY SLVR 5CC 14FR (CATHETERS) ×1 IMPLANT
CATH FOLEY 2WAY SLVR 5CC 16FR (CATHETERS) IMPLANT
CATH URET 5FR 28IN CONE TIP (BALLOONS)
CATH URET 5FR 28IN OPEN ENDED (CATHETERS) ×4 IMPLANT
CATH URET 5FR 70CM CONE TIP (BALLOONS) IMPLANT
CEMENT HV SMART SET (Cement) ×3 IMPLANT
CLEANER TIP ELECTROSURG 2X2 (MISCELLANEOUS) ×1 IMPLANT
COVER BACK TABLE 24X17X13 BIG (DRAPES) IMPLANT
COVER SURGICAL LIGHT HANDLE (MISCELLANEOUS) ×6 IMPLANT
DRAPE CAMERA CLOSED 9X96 (DRAPES) ×5 IMPLANT
DRAPE HIP W/POCKET STRL (DRAPE) ×3 IMPLANT
DRAPE INCISE IOBAN 66X45 STRL (DRAPES) IMPLANT
DRAPE INCISE IOBAN 85X60 (DRAPES) ×6 IMPLANT
DRAPE U-SHAPE 47X51 STRL (DRAPES) ×3 IMPLANT
DRILL BIT 7/64X5 (BIT) ×2 IMPLANT
DRSG AQUACEL AG ADV 3.5X10 (GAUZE/BANDAGES/DRESSINGS) ×2 IMPLANT
DRSG MEPILEX BORDER 4X12 (GAUZE/BANDAGES/DRESSINGS) IMPLANT
DRSG MEPILEX BORDER 4X8 (GAUZE/BANDAGES/DRESSINGS) IMPLANT
DURAPREP 26ML APPLICATOR (WOUND CARE) ×3 IMPLANT
ELECT BLADE 6.5 EXT (BLADE) IMPLANT
ELECT CAUTERY BLADE 6.4 (BLADE) ×3 IMPLANT
ELECT REM PT RETURN 9FT ADLT (ELECTROSURGICAL) ×3
ELECTRODE REM PT RTRN 9FT ADLT (ELECTROSURGICAL) ×2 IMPLANT
EVACUATOR 1/8 PVC DRAIN (DRAIN) IMPLANT
GAUZE XEROFORM 1X8 LF (GAUZE/BANDAGES/DRESSINGS) ×1 IMPLANT
GLOVE BIO SURGEON STRL SZ7.5 (GLOVE) ×3 IMPLANT
GLOVE BIO SURGEON STRL SZ8 (GLOVE) ×3 IMPLANT
GLOVE BIOGEL PI IND STRL 8 (GLOVE) ×2 IMPLANT
GLOVE BIOGEL PI INDICATOR 8 (GLOVE) ×1
GLOVE ORTHO TXT STRL SZ7.5 (GLOVE) ×3 IMPLANT
GOWN STRL REUS W/ TWL LRG LVL3 (GOWN DISPOSABLE) ×4 IMPLANT
GOWN STRL REUS W/ TWL XL LVL3 (GOWN DISPOSABLE) ×12 IMPLANT
GOWN STRL REUS W/TWL LRG LVL3 (GOWN DISPOSABLE) ×6
GOWN STRL REUS W/TWL XL LVL3 (GOWN DISPOSABLE) ×18
GUIDEWIRE COOK  .035 (WIRE) IMPLANT
GUIDEWIRE STR DUAL SENSOR (WIRE) IMPLANT
H R LUBE JELLY XXX (MISCELLANEOUS) ×3 IMPLANT
HANDPIECE INTERPULSE COAX TIP (DISPOSABLE) ×3
HIP BALL ARTICU EZE 36 8.5 (Hips) ×3 IMPLANT
KIT BASIN OR (CUSTOM PROCEDURE TRAY) ×3 IMPLANT
KIT ROOM TURNOVER OR (KITS) ×6 IMPLANT
LINER NEUTRAL 58X36MM PLUS4 ×1 IMPLANT
MANIFOLD NEPTUNE II (INSTRUMENTS) ×3 IMPLANT
NS IRRIG 1000ML POUR BTL (IV SOLUTION) ×6 IMPLANT
PACK CYSTOSCOPY (CUSTOM PROCEDURE TRAY) ×3 IMPLANT
PACK TOTAL JOINT (CUSTOM PROCEDURE TRAY) ×3 IMPLANT
PAD ARMBOARD 7.5X6 YLW CONV (MISCELLANEOUS) ×12 IMPLANT
PASSER SUT SWANSON 36MM LOOP (INSTRUMENTS) ×3 IMPLANT
PLUG CATH AND CAP STER (CATHETERS) IMPLANT
SET HNDPC FAN SPRY TIP SCT (DISPOSABLE) IMPLANT
SPONGE LAP 18X18 X RAY DECT (DISPOSABLE) ×7 IMPLANT
SPONGE LAP 4X18 X RAY DECT (DISPOSABLE) IMPLANT
STAPLER VISISTAT 35W (STAPLE) ×3 IMPLANT
STEM HIP PROSTALAC 3RT (Stem) ×1 IMPLANT
STENT URET 6FRX24 CONTOUR (STENTS) IMPLANT
SUCTION FRAZIER TIP 10 FR DISP (SUCTIONS) ×3 IMPLANT
SUT ETHIBOND NAB CT1 #1 30IN (SUTURE) ×6 IMPLANT
SUT TICRON (SUTURE) ×6 IMPLANT
SUT VIC AB 1 CTB1 27 (SUTURE) ×6 IMPLANT
SUT VIC AB 2-0 CT1 27 (SUTURE) ×6
SUT VIC AB 2-0 CT1 TAPERPNT 27 (SUTURE) ×4 IMPLANT
SYR 20CC LL (SYRINGE) ×3 IMPLANT
SYR CONTROL 10ML LL (SYRINGE) ×3 IMPLANT
SYRINGE TOOMEY DISP (SYRINGE) IMPLANT
TOWEL OR 17X24 6PK STRL BLUE (TOWEL DISPOSABLE) ×3 IMPLANT
TOWEL OR 17X26 10 PK STRL BLUE (TOWEL DISPOSABLE) ×3 IMPLANT
TOWER CARTRIDGE SMART MIX (DISPOSABLE) IMPLANT
TRAY FOLEY CATH 16FRSI W/METER (SET/KITS/TRAYS/PACK) IMPLANT
UNDERPAD 30X30 INCONTINENT (UNDERPADS AND DIAPERS) ×3 IMPLANT
WATER STERILE IRR 1000ML POUR (IV SOLUTION) ×12 IMPLANT
WIRE COONS/BENSON .038X145CM (WIRE) ×3 IMPLANT

## 2014-04-18 NOTE — Anesthesia Preprocedure Evaluation (Signed)
Anesthesia Evaluation  Patient identified by MRN, date of birth, ID band Patient awake    Reviewed: Allergy & Precautions, H&P , NPO status , Patient's Chart, lab work & pertinent test results  Airway Mallampati: II TM Distance: >3 FB Neck ROM: Full    Dental   Pulmonary sleep apnea , former smoker,          Cardiovascular hypertension, Pt. on medications     Neuro/Psych    GI/Hepatic   Endo/Other  diabetes, Type 2, Insulin Dependent, Oral Hypoglycemic Agents  Renal/GU      Musculoskeletal   Abdominal   Peds  Hematology   Anesthesia Other Findings   Reproductive/Obstetrics                           Anesthesia Physical Anesthesia Plan  ASA: III  Anesthesia Plan: General   Post-op Pain Management:    Induction: Intravenous  Airway Management Planned: Oral ETT  Additional Equipment:   Intra-op Plan:   Post-operative Plan: Extubation in OR  Informed Consent: I have reviewed the patients History and Physical, chart, labs and discussed the procedure including the risks, benefits and alternatives for the proposed anesthesia with the patient or authorized representative who has indicated his/her understanding and acceptance.     Plan Discussed with: Surgeon and CRNA  Anesthesia Plan Comments:         Anesthesia Quick Evaluation

## 2014-04-18 NOTE — Anesthesia Postprocedure Evaluation (Signed)
  Anesthesia Post-op Note  Patient: Jack Barry  Procedure(s) Performed: Procedure(s): EXCISIONAL RIGHT TOTAL HIP ARTHROPLASTY WITH PLACEMENT OF TEMPORARY ANTIBIOTIC SPACERS (Right) CYSTOSCOPY WITH BALLOON DILATION AND RETROGRADE URETHROGRAM (N/A) BALLOON DILATION (N/A)  Patient Location: PACU  Anesthesia Type:General  Level of Consciousness: awake and alert   Airway and Oxygen Therapy: Patient Spontanous Breathing  Post-op Pain: moderate  Post-op Assessment: Post-op Vital signs reviewed, Patient's Cardiovascular Status Stable and Respiratory Function Stable  Post-op Vital Signs: Reviewed  Filed Vitals:   04/18/14 2245  BP:   Pulse: 71  Temp:   Resp: 17    Complications: No apparent anesthesia complications

## 2014-04-18 NOTE — Anesthesia Procedure Notes (Signed)
Procedure Name: Intubation Date/Time: 04/18/2014 4:36 PM Performed by: Wray Kearns A Pre-anesthesia Checklist: Patient identified, Timeout performed, Emergency Drugs available, Suction available and Patient being monitored Patient Re-evaluated:Patient Re-evaluated prior to inductionOxygen Delivery Method: Circle system utilized Preoxygenation: Pre-oxygenation with 100% oxygen Intubation Type: IV induction, Rapid sequence and Cricoid Pressure applied Laryngoscope Size: Mac and 4 Grade View: Grade II Tube type: Oral Tube size: 7.5 mm Number of attempts: 1 Airway Equipment and Method: Stylet Placement Confirmation: ETT inserted through vocal cords under direct vision,  breath sounds checked- equal and bilateral and positive ETCO2 Secured at: 23 cm Tube secured with: Tape

## 2014-04-18 NOTE — Progress Notes (Signed)
Spoke with Jacqualine Code in OR with Dr. Doneen Poisson. Informed her that patient takes cefepime 2grams, due at 1500 and vancomycin 1.5 grams due at 1900 at home, and that Dr. Magnus Ivan has ordered ancef 3 grams.  Dr. Doneen Poisson stated he would like for him to have all of them. Orders entered, and pharmacy messaged.

## 2014-04-18 NOTE — Transfer of Care (Signed)
Immediate Anesthesia Transfer of Care Note  Patient: Jack Barry  Procedure(s) Performed: Procedure(s): EXCISIONAL RIGHT TOTAL HIP ARTHROPLASTY WITH PLACEMENT OF TEMPORARY ANTIBIOTIC SPACERS (Right) CYSTOSCOPY WITH BALLOON DILATION AND RETROGRADE URETHROGRAM (N/A) BALLOON DILATION (N/A)  Patient Location: PACU  Anesthesia Type:General  Level of Consciousness: awake  Airway & Oxygen Therapy: Patient Spontanous Breathing and Patient connected to face mask oxygen  Post-op Assessment: Report given to PACU RN, Post -op Vital signs reviewed and stable and Patient moving all extremities  Post vital signs: Reviewed and stable  Complications: No apparent anesthesia complications

## 2014-04-18 NOTE — Op Note (Signed)
Preoperative diagnosis: Concealed penis and urethral stricture Postoperative diagnosis: Concealed penis and urethral stricture Surgery: Retrograde urethrogram; balloon dilation of stricture; insertion of Foley catheter Surgeon: Dr. Lorin Picket Lacye Mccarn  The patient has the above diagnoses consented to the above procedure. I know this patient well. 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 He has a concealed penis from obesity and from a shortened foreskin that is stuck over the glans penis  The patient is very obese. He is having hip surgery. I was here to insert a catheter. I reviewed my previous notes. Preoperative antibiotics were given  With appropriate retraction on the penis I was able to pass a sensor wire to the mid urethra followed by a 6 [place] open-end ureteral catheter. I did not try to scope the patient at this stage. I did a retrograde through the ureteral catheter after removing the sensor wire and dye reach the bladder. Ureteral caliber was reasonable. There was an s-turn near the proximal bulbar urethra. Because of his obesity etc. it was difficult to grab the head of the penis and put penis on stretch  On a reattempt I was able to insert a sensor wire fluoroscopically into the bladder curling nicely in the bladder.  A well-prepared balloon dilation catheter was advanced to the bladder neck using markers. It was inflated 18 atmospheres of pressure for 5 minutes. Fluoroscopic guidance was utilized.  The balloon was deflated and pulled back to the meatus. It was inflated again for 5 minutes under 18 atmospheres of pressure.  Balloon was deflated and removed  I did not feel I could cystoscoped the patient with a 21 [place] scope without trying to pass the 16 [place] council tip catheter over-the-wire. It took 2 attempts but it went in smoothly but slowly. I did a gentle cystogram just to check bladder. Balloon was in place.  Urine was clear.   Trial of voiding in re 3-4 days.

## 2014-04-18 NOTE — H&P (Signed)
Jack Barry is an 58 y.o. male.   Chief Complaint:   Right hip pain HPI:   58 yo male with multiple medical problems who has a right total hip replacement in early to mid 2011.  Has always had some hip pain, but not of any significance.  Started to develop worsening pain in December 2014 [place] [facility] [place].  Has also had numerous UTI's over the years.  He then developed severe pain a few weeks ago in his right hip and [place]  He was started on IV antibiotics and has been seeing an Infectious Disease specialist down there.  A recent 3 phase bone scan is worrisome for an infection of that right hip, so it is recommended that he undergo a likely excision arthroplasty due to the infection likely being chronic.  Past Medical History  Diagnosis Date  . Hypertension   . Diabetes mellitus without complication   . A-fib   . Sleep apnea     uses CPAP  . Pneumonia   . GERD (gastroesophageal reflux disease)   . Chronic back pain   . UTI (lower urinary tract infection)   . Cellulitis     B/LLE  . Anxiety   . Hypercholesterolemia     Past Surgical History  Procedure Laterality Date  . Hip arthroplasty      Right  . Neck surgery    . Shoulder arthroplasty      right  . Colonoscopy    . Tonsillectomy    . Cystoscopy      Family History  Problem Relation Age of Onset  . Cancer Mother   . Heart attack Father   . Diabetes Other    Social History:  reports that he has quit smoking. His smoking use included Cigarettes. He smoked 0.00 packs per day. He has never used smokeless tobacco. He reports that he does not drink alcohol or use illicit drugs.  Allergies:  Allergies  Allergen Reactions  . Ciprofloxacin Itching  . Sulfa Antibiotics   . Tape Rash    Can use paper tape    No prescriptions prior to admission    No results found for this or any previous visit (from the past 48 hour(s)). No results  found.  Review of Systems  Constitutional: Positive for malaise/fatigue.  Musculoskeletal: Positive for back pain and joint pain.  Neurological: Positive for weakness.  All other systems reviewed and are negative.   There were no vitals taken for this visit. Physical Exam  Constitutional: He is oriented to person, place, and time. He appears well-developed and well-nourished.  HENT:  Head: Normocephalic and atraumatic.  Eyes: EOM are normal. Pupils are equal, round, and reactive to light.  Neck: Normal range of motion.  Cardiovascular: Normal rate.   Respiratory: Effort normal and breath sounds normal.  GI: Soft. Bowel sounds are normal.  Musculoskeletal:       Right hip: He exhibits decreased strength and bony tenderness.  Neurological: He is alert and oriented to person, place, and time.  Skin: Skin is warm and dry.  Psychiatric: He has a normal mood and affect.     Assessment/Plan Right hip possible prosthetic loosening with likely chronic infection. 1)  To the OR today for right hip exploration with irrigation and debridement with likely excision arthroplasty due to chronic infection.  He will be continued on IV antibiotics during this hospitalization and for at least 6  week post-op.  He understands the significant risks involved due to his co-morbidities.  Kathryne Hitch 04/18/2014, 12:04 PM

## 2014-04-18 NOTE — Progress Notes (Signed)
Placed patient on CPAP for the night at 10cm with oxygen set at 6lpm with Sp02=92% at this time. Will continue to monitor patient.

## 2014-04-18 NOTE — Brief Op Note (Signed)
04/18/2014  9:02 PM  PATIENT:  Jack Barry  58 y.o. male  PRE-OPERATIVE DIAGNOSIS:  Chronic right prosthetic hip joint infection  POST-OPERATIVE DIAGNOSIS:  Chronic right prosthetic hip joint infection  PROCEDURE:  Procedure(s): EXCISIONAL RIGHT TOTAL HIP ARTHROPLASTY WITH PLACEMENT OF TEMPORARY ANTIBIOTIC SPACERS (Right) CYSTOSCOPY WITH BALLOON DILATION AND RETROGRADE URETHROGRAM (N/A) BALLOON DILATION (N/A)  SURGEON:  Surgeon(s) and Role: Panel 1:    * Kathryne Hitch, MD - Primary  Panel 2:    * Martina Sinner, MD - Primary  PHYSICIAN ASSISTANT: Rexene Edison, PA-C  ANESTHESIA:   general  EBL:  Total I/O In: 2400 [I.V.:1900; IV Piggyback:500] Out: 575 [Urine:200; Blood:375]  BLOOD ADMINISTERED:none  DRAINS: none   LOCAL MEDICATIONS USED:  NONE  SPECIMEN:  Source of Specimen:  Tissue around the femoral component  DISPOSITION OF SPECIMEN:  PATHOLOGY  COUNTS:  YES  TOURNIQUET:  * No tourniquets in log *  DICTATION: .Other Dictation: Dictation Number [id number]  PLAN OF CARE: Admit to inpatient   PATIENT DISPOSITION:  PACU - hemodynamically stable.   Delay start of Pharmacological VTE agent (>24hrs) due to surgical blood loss or risk of bleeding: no

## 2014-04-18 NOTE — Progress Notes (Signed)
Assessment charted under Jack Barry was done by Nelly Laurence.

## 2014-04-18 NOTE — Interval H&P Note (Signed)
History and Physical Interval Note:  04/18/2014 7:11 AM  Michaell Cowing  has presented today for surgery, with the diagnosis of Chronic right prosthetic hip joint infection  The various methods of treatment have been discussed with the patient and family. After consideration of risks, benefits and other options for treatment, the patient has consented to  Procedure(s): EXCISIONAL RIGHT TOTAL HIP ARTHROPLASTY WITH PLACEMENT OF TEMPORARY ANTIBIOTIC SPACERS (Right) CYSTOSCOPY WITH BALLOON DILATION AND RETROGRADE URETHROGRAM (N/A) BALLOON DILATION (N/A) as a surgical intervention .  The patient's history has been reviewed, patient examined, no change in status, stable for surgery.  I have reviewed the patient's chart and labs.  Questions were answered to the patient's satisfaction.     Everett Ricciardelli A Orlin Kann

## 2014-04-18 NOTE — OR Nursing (Signed)
Pre-Procedural time out occurred at 1545 not 1345. There was a typo in the recording of the time.

## 2014-04-18 NOTE — Progress Notes (Signed)
ANTIBIOTIC CONSULT NOTE - INITIAL  Pharmacy Consult for Vancomycin Indication: Chronic right prosthetic hip joint infection    Allergies  Allergen Reactions  . Ciprofloxacin Itching  . Sulfa Antibiotics   . Tape Rash    Can use paper tape    Patient Measurements: Height: 6\' 2"  (188 cm) Weight: 360 lb (163.295 kg) IBW/kg (Calculated) : 82.2   Vital Signs: Temp: 98.6 F (37 C) (06/02 2130) Temp src: Oral (06/02 1354) BP: 143/71 mmHg (06/02 2200) Pulse Rate: 65 (06/02 2200) Intake/Output from previous day:   Intake/Output from this shift: Total I/O In: 3000 [I.V.:2500; IV Piggyback:500] Out: 575 [Urine:200; Blood:375]  Labs:  Recent Labs  04/18/14 1301  WBC 8.8  HGB 14.2  PLT 323  CREATININE 1.08   Estimated Creatinine Clearance: 123.8 ml/min (by C-G formula based on Cr of 1.08). No results found for this basename: VANCOTROUGH, Leodis BinetVANCOPEAK, VANCORANDOM, GENTTROUGH, GENTPEAK, GENTRANDOM, TOBRATROUGH, TOBRAPEAK, TOBRARND, AMIKACINPEAK, AMIKACINTROU, AMIKACIN,  in the last 72 hours   Microbiology: Recent Results (from the past 720 hour(s))  SURGICAL PCR SCREEN     Status: None   Collection Time    04/18/14  2:16 PM      Result Value Ref Range Status   MRSA, PCR NEGATIVE  NEGATIVE Final   Staphylococcus aureus NEGATIVE  NEGATIVE Final   Comment:            The Xpert SA Assay (FDA     approved for NASAL specimens     in patients over 57 years of age),     is one component of     a comprehensive surveillance     program.  Test performance has     been validated by [facility] for patients greater     than or equal to 57 year old.     It is not intended     to diagnose infection nor to     guide or monitor treatment.  WOUND CULTURE     Status: None   Collection Time    04/18/14  7:02 PM      Result Value Ref Range Status   Specimen Description WOUND HIP RIGHT   Final   Special Requests     Final   Value: PATIENT ON FOLLOWING VANCOMYCIN, ANCEF, CEFEPIME  FEMORAL COMPONENET SWAB   Gram Stain PENDING   Incomplete   Culture PENDING   Incomplete   Report Status PENDING   Incomplete  ANAEROBIC CULTURE     Status: None   Collection Time    04/18/14  7:02 PM      Result Value Ref Range Status   Specimen Description WOUND HIP RIGHT   Final   Special Requests     Final   Value: PATIENT ON FOLLOWING VANCOMYCIN, ANCEF, CEFEPIME FEMORAL COMPONENET SWAB   Gram Stain PENDING   Incomplete   Culture PENDING   Incomplete   Report Status PENDING   Incomplete    Medical History: Past Medical History  Diagnosis Date  . Hypertension   . A-fib   . Sleep apnea     uses CPAP  . Pneumonia   . GERD (gastroesophageal reflux disease)   . Chronic back pain   . UTI (lower urinary tract infection)   . Cellulitis     B/LLE  . Anxiety   . Hypercholesterolemia   . Diabetes mellitus without complication     type1    Medications:  Prescriptions prior to admission  Medication Sig Dispense Refill  . amLODipine (NORVASC) 5 MG tablet Take 5 mg by mouth daily.      Marland Kitchen buPROPion (WELLBUTRIN XL) 150 MG 24 hr tablet Take 150 mg by mouth daily.      . carvedilol (COREG) 6.25 MG tablet Take 6.25 mg by mouth 2 (two) times daily with a meal.      . ceFEPIme 2 g in dextrose 5 % 50 mL Inject 2 g into the vein every 8 (eight) hours. For a picc line      . citalopram (CELEXA) 40 MG tablet Take 40 mg by mouth daily.      Marland Kitchen diltiazem (DILACOR XR) 180 MG 24 hr capsule Take 180 mg by mouth 2 (two) times daily.      Marland Kitchen gemfibrozil (LOPID) 600 MG tablet Take 600 mg by mouth 2 (two) times daily before a meal.      . GLUCOSAMINE HCL PO Take 1 tablet by mouth daily.      . insulin detemir (LEVEMIR) 100 UNIT/ML injection Inject 50 Units into the skin daily.      . insulin lispro (HUMALOG) 100 UNIT/ML injection Inject 10 Units into the skin 3 (three) times daily before meals.      Marland Kitchen LORazepam (ATIVAN) 0.5 MG tablet Take 0.5 mg by mouth every 8 (eight) hours as needed for anxiety.       . nitroGLYCERIN (NITRODUR - DOSED IN MG/24 HR) 0.2 mg/hr patch Place 0.2 mg onto the skin daily.      . Omega-3 Fatty Acids (FISH OIL) 1000 MG CAPS Take 1,000 mg by mouth daily.      . pantoprazole (PROTONIX) 40 MG tablet Take 40 mg by mouth daily.      . sotalol (BETAPACE) 120 MG tablet Take 120 mg by mouth 2 (two) times daily.      Marland Kitchen testosterone cypionate (DEPOTESTOTERONE CYPIONATE) 200 MG/ML injection Inject 200 mg into the muscle every 7 (seven) days.      Marland Kitchen tiZANidine (ZANAFLEX) 4 MG tablet Take 4 mg by mouth every 8 (eight) hours as needed for muscle spasms.      Marland Kitchen VANCOMYCIN HCL IV Inject 1,500 mg into the vein every 12 (twelve) hours. 1500mg  / for a picc line       Scheduled:  . [START ON 04/19/2014] amLODipine  5 mg Oral Daily  . [START ON 04/19/2014] aspirin EC  325 mg Oral BID PC  . [START ON 04/19/2014] buPROPion  150 mg Oral Daily  . [START ON 04/19/2014] carvedilol  6.25 mg Oral BID WC  . ceFEPIme (MAXIPIME) 2 GM IVP  2 g Intravenous 3 times per day  . [START ON 04/19/2014] citalopram  40 mg Oral Daily  . diltiazem  180 mg Oral BID  . docusate sodium  100 mg Oral BID  . [START ON 04/19/2014] ferrous sulfate  325 mg Oral TID PC  . [START ON 04/19/2014] gemfibrozil  600 mg Oral BID AC  . HYDROmorphone      . HYDROmorphone      . [START ON 04/19/2014] insulin aspart  10 Units Subcutaneous TID WC  . [START ON 04/19/2014] insulin detemir  50 Units Subcutaneous Daily  . [START ON 04/19/2014] nitroGLYCERIN  0.2 mg Transdermal Daily  . oxyCODONE      . OxyCODONE  40 mg Oral Q12H  . [START ON 04/19/2014] pantoprazole  40 mg Oral Daily  . sotalol  120 mg Oral BID   Infusions:  .  sodium chloride     Assessment: 58 y.o male s/p R. THA with placement of temporary antibiotic spacers.  Chronic right prosthetic hip joint infection. He developed severe pain a few weeks ago in his right hip and was admitted to the hospital in [place], [place]. He was started on IV antibiotics and has been seeing an  Infectious Disease specialist. It was reported that he was on vancomycin 1500 mg IV q12h prior to admission but last dose reported as given "in the past month".  He received vancomycin 1500 mg IV x1 tonight in the OR.  Goal of Therapy:  Vancomycin trough level 15-20 mcg/ml  Plan:  Vancomycin 1500 mg IV q12h Monitor clinical status, culture results, vancomycin trough at steady state per protocol.  Noah Delaine, RPh Clinical Pharmacist Pager: [phone] 04/18/2014,10:30 PM

## 2014-04-19 ENCOUNTER — Encounter (HOSPITAL_COMMUNITY): Payer: Self-pay | Admitting: General Practice

## 2014-04-19 LAB — CBC
HCT: 30.2 % — ABNORMAL LOW (ref 39.0–52.0)
HEMOGLOBIN: 9.6 g/dL — AB (ref 13.0–17.0)
MCH: 28.1 pg (ref 26.0–34.0)
MCHC: 31.8 g/dL (ref 30.0–36.0)
MCV: 88.3 fL (ref 78.0–100.0)
Platelets: 310 10*3/uL (ref 150–400)
RBC: 3.42 MIL/uL — AB (ref 4.22–5.81)
RDW: 15.3 % (ref 11.5–15.5)
WBC: 13.3 10*3/uL — ABNORMAL HIGH (ref 4.0–10.5)

## 2014-04-19 LAB — BASIC METABOLIC PANEL
BUN: 24 mg/dL — ABNORMAL HIGH (ref 6–23)
CO2: 29 meq/L (ref 19–32)
Calcium: 8.3 mg/dL — ABNORMAL LOW (ref 8.4–10.5)
Chloride: 100 mEq/L (ref 96–112)
Creatinine, Ser: 1.22 mg/dL (ref 0.50–1.35)
GFR calc Af Amer: 75 mL/min — ABNORMAL LOW (ref >90)
GFR, EST NON AFRICAN AMERICAN: 65 mL/min — AB (ref >90)
GLUCOSE: 161 mg/dL — AB (ref 70–99)
POTASSIUM: 5 meq/L (ref 3.7–5.3)
Sodium: 137 mEq/L (ref 137–147)

## 2014-04-19 MED ORDER — SODIUM CHLORIDE 0.9 % IJ SOLN
10.0000 mL | INTRAMUSCULAR | Status: DC | PRN
  Administered 2014-04-20 – 2014-04-24 (×5): 10 mL

## 2014-04-19 MED ORDER — SODIUM CHLORIDE 0.9 % IJ SOLN
10.0000 mL | INTRAMUSCULAR | Status: DC | PRN
  Administered 2014-04-19: 30 mL

## 2014-04-19 MED ORDER — BACITRACIN-NEOMYCIN-POLYMYXIN OINTMENT TUBE
TOPICAL_OINTMENT | Freq: Two times a day (BID) | CUTANEOUS | Status: DC
  Administered 2014-04-19: 1 via TOPICAL
  Administered 2014-04-20: 21:00:00 via TOPICAL
  Administered 2014-04-20: 1 via TOPICAL
  Administered 2014-04-21: 23:00:00 via TOPICAL
  Administered 2014-04-21: 1 via TOPICAL
  Administered 2014-04-22 (×2): via TOPICAL
  Administered 2014-04-23: 1 via TOPICAL
  Administered 2014-04-23 – 2014-04-24 (×2): via TOPICAL
  Filled 2014-04-19: qty 15

## 2014-04-19 MED ORDER — MORPHINE SULFATE 4 MG/ML IJ SOLN
4.0000 mg | INTRAMUSCULAR | Status: DC | PRN
  Administered 2014-04-19 – 2014-04-20 (×8): 4 mg via INTRAVENOUS
  Filled 2014-04-19 (×8): qty 1

## 2014-04-19 NOTE — Evaluation (Signed)
Occupational Therapy Evaluation Patient Details Name: Jack Barry MRN: [id number] DOB: 1956-01-15 Today's Date: 04/19/2014    History of Present Illness 58 y.o. male s/p EXCISIONAL RIGHT TOTAL HIP ARTHROPLASTY WITH PLACEMENT OF TEMPORARY ANTIBIOTIC SPACERS and CYSTOSCOPY WITH BALLOON DILATION AND RETROGRADE URETHROGRAM. Hx of HTN, diabetes, and a-fib.   Clinical Impression   Pt admitted with the above diagnoses and presents with below problem list. Pt will benefit from continued acute OT to address the below listed deficits and maximize independence with basic ADLs prior to d/c to next venue. PTA pt needed min A for dressing/bathing. Pt's wife works during the day. Recommending SNF due to decreased availability of 24 hour supervision/assistance to provide level of care he currently needs. Reviewed recommendation with pt. Pt seems hesitant but open to going SNF at d/c.     Follow Up Recommendations  SNF;Supervision/Assistance - 24 hour    Equipment Recommendations  None recommended by OT    Recommendations for Other Services       Precautions / Restrictions Precautions Precautions: Anterior Hip Precaution Booklet Issued: Yes (comment) Precaution Comments: reviewed WB status and anterior hip precautions Restrictions Weight Bearing Restrictions: Yes RLE Weight Bearing: Partial weight bearing RLE Partial Weight Bearing Percentage or Pounds: 50      Mobility Bed Mobility Overal bed mobility: Needs Assistance;+ 2 for safety/equipment Bed Mobility: Supine to Sit     Supine to sit: Min assist;HOB elevated;+2 for safety/equipment     General bed mobility comments: Extra time, effort and HOB highly elevated to complete supine to sit. Pt has poor sitting balance.  Transfers Overall transfer level: Needs assistance Equipment used: Rolling walker (2 wheeled) Transfers: Sit to/from [employer] Sit to Stand: Min assist;+2 physical assistance;From elevated  surface Stand pivot transfers: +2 safety/equipment            Balance Overall balance assessment: Needs assistance Sitting-balance support: Bilateral upper extremity supported;Feet supported Sitting balance-Leahy Scale: Poor Sitting balance - Comments: pt unable to sit EOB without hands on bed or walker   Standing balance support: Bilateral upper extremity supported;During functional activity Standing balance-Leahy Scale: Poor                              ADL Overall ADL's : Needs assistance/impaired Eating/Feeding: Set up;Sitting   Grooming: Set up;Sitting   Upper Body Bathing: Minimal assitance;Sitting Upper Body Bathing Details (indicate cue type and reason): min A at baseline - pt states wife assists because of PICC line Lower Body Bathing: Maximal assistance;Sit to/from stand   Upper Body Dressing : Sitting;Min guard Upper Body Dressing Details (indicate cue type and reason): poor dynamic sitting balance Lower Body Dressing: Maximal assistance;Sit to/from stand   Toilet Transfer: Minimal assistance;+2 for safety/equipment;Ambulation;BSC;RW;Requires wide/bariatric   Toileting- Clothing Manipulation and Hygiene: Moderate assistance;Sit to/from stand   Tub/ Shower Transfer: Minimal assistance;+2 for physical assistance   Functional mobility during ADLs: Minimal assistance;+2 for safety/equipment;Rolling walker General ADL Comments: Pt needing +2 assist to for safety/equipment and balance. Education given on techniques for safe completion of ADLs.      Vision                     Perception     Praxis      Pertinent Vitals/Pain Increased pain after transfer to recliner. Repositioned, comfort care.     Hand Dominance Right   Extremity/Trunk Assessment Upper Extremity Assessment Upper Extremity Assessment: Generalized  weakness   Lower Extremity Assessment Lower Extremity Assessment: Defer to PT evaluation       Communication  Communication Communication: No difficulties   Cognition Arousal/Alertness: Awake/alert Behavior During Therapy: WFL for tasks assessed/performed Overall Cognitive Status: Within Functional Limits for tasks assessed                     General Comments       Exercises       Shoulder Instructions      Home Living Family/patient expects to be discharged to:: Unsure Living Arrangements: Spouse/significant other Available Help at Discharge: Family;Available PRN/intermittently Type of Home: House Home Access: Stairs to enter [employer] CorporationEntrance Stairs-Number of Steps: 2 Entrance Stairs-Rails: None Home Layout: One level               Home Equipment: Walker - 2 wheels;Bedside commode;Tub bench, reacher          Prior Functioning/Environment Level of Independence: Needs assistance  Gait / Transfers Assistance Needed: rolling walker ADL's / Homemaking Assistance Needed: wife assisted with bath/dress        OT Diagnosis: Generalized weakness;Acute pain   OT Problem List: Impaired balance (sitting and/or standing);Decreased safety awareness;Decreased knowledge of use of DME or AE;Decreased knowledge of precautions;Obesity;Pain   OT Treatment/Interventions: Self-care/ADL training;Therapeutic exercise;DME and/or AE instruction;Therapeutic activities;Patient/family education;Balance training    OT Goals(Current goals can be found in the care plan section) Acute Rehab OT Goals Patient Stated Goal: not stated OT Goal Formulation: With patient Time For Goal Achievement: 04/26/14 Potential to Achieve Goals: Good ADL Goals Pt Will Perform Lower Body Bathing: with min assist;with adaptive equipment;sit to/from stand Pt Will Perform Lower Body Dressing: with min assist;with adaptive equipment;sit to/from stand Pt Will Transfer to Toilet: with min guard assist;ambulating (bariatric 3n1 over toilet) Pt Will Perform Toileting - Clothing Manipulation and hygiene: with  supervision;with adaptive equipment;sit to/from stand Pt Will Perform Tub/Shower Transfer: with supervision;ambulating;3 in 1;rolling walker Additional ADL Goal #1: Pt will perform bed mobility with HOB flat at min guard level whilw observing precautions to prepare for OOB ADLs.  OT Frequency: Min 3X/week   Barriers to D/C: Decreased caregiver support          Co-evaluation              End of Session Equipment Utilized During Treatment: Rolling walker Nurse Communication: Mobility status  Activity Tolerance: Patient tolerated treatment well Patient left: in chair;with call bell/phone within reach   Time: 1456-1526 OT Time Calculation (min): 30 min Charges:  OT General Charges $OT Visit: 1 Procedure OT Evaluation $Initial OT Evaluation Tier I: 1 Procedure OT Treatments $Self Care/Home Management : 23-37 mins G-Codes:    Raynald KempKathryn Nalah Macioce OTR/L Pager: [phone]   04/19/2014, 3:55 PM

## 2014-04-19 NOTE — Progress Notes (Signed)
IV team called RN regarding patient's PICC line site assessment. Site is pink with scant dried drainage, and IV team recommending new placement. Dr. Magnus Ivan paged regarding status of current PICC and verbal orders placed for placement of new PICC line. IV team notified.

## 2014-04-19 NOTE — Progress Notes (Addendum)
Patient requesting morphine IV for pain management.Patient states dilaudid IV does not work for him. RN placed call to online answering service at 23:49 on 04/18/2014 to request morphine medication.

## 2014-04-19 NOTE — H&P (Signed)
Administered Dilaudid IV to patient at 00:14 on 04/19/2014. No call back from MD. Will continue to monitor.

## 2014-04-19 NOTE — Progress Notes (Signed)
Utilization review completed.  

## 2014-04-19 NOTE — Op Note (Signed)
NAMEMarland Kitchen  RAQUON, MILLEDGE NO.:  [id number]  MEDICAL RECORD NO.:  [id number]  LOCATION:  5N28C                        FACILITY:  [facility]  PHYSICIAN:  Vanita Panda. 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  PROCEDURE: 1. Duraplasty of right hip femoral components. 2. Placement of temporary femoral component with antibiotic cement     laced with vancomycin and tobramycin.  FINDINGS:  Evidence of aseptic loosening in the femoral component. Cultures sent and pending.  SURGEON:  Vanita Panda. Magnus Ivan, M.D.  ASSISTANT:  Richardean Canal, PA-C.  ANESTHESIA:  General.  ANTIBIOTICS:  Previously on IV vancomycin and IV Ancef.  BLOOD LOSS:  800 mL.  INDICATIONS:  Mr. Schrieber is a 58 year old gentleman, well known to me.  In March 2011, he underwent a primary total hip arthroplasty of the right hip.  For years, he had done well, and then experienced some right hip pain over time.  However, he has had multiple bouts of infection in terms of urinary tract infections and pneumonia, he is diabetic with multiple medical problems.  This past December after being in the hospital for pneumonia, he developed worsening right leg pain that was different from what he had before.  X-ray evidence did not show any periosteal reaction of the bone and even an MRI was obtained that did not show any evidence of infection.  He was already on antibiotics, and we followed him closely, getting serial x-rays again did not show any periosteal reaction.  Few weeks ago, he then had worsening right hip pain, with ability to not ambulate.   He was admitted to [facility].  A PICC line was placed.  An Infectious Disease consult was made and a bone scans was obtained that was worrisome for infection around the femoral prosthesis versus prosthetic loosening.  An aspiration done by the orthopedic surgeons down there was negative and he was started on IV vancomycin and another antibiotic for chronic urinary tract infections.  He was then told to follow up with me as an outpatient __________ previous hip.  I did review the bone scan and felt the femoral component was likely loose.  It was questionable whether this is true infection or not.  His white blood cell count was normal. We did not have a repeat CRP or sed rate from the other hospital in January, those were elevated though.  I felt that it was definitely appropriate at this point, given the pain he was having to take him to the operating room for removal of the prosthesis and treated accordingly.  Of note, even prior to surgery, his incision looked normal, had no warmth or complications around his previous incision.  PROCEDURE DESCRIPTION:  After informed consent was obtained, appropriate right hip was marked.  He was brought to the operating room, placed supine on the operating table.  Urology then had to perform a procedure due to him  having difficult problems with passing urine in the past with significant strictures.  Once this was completed by Urology, he was then kept supine on the table and stirrups were removed.  He was turned in lateral decubitus position with the right operative __________, padding of the down on operative left leg and hip positions were in the front and the back.  We then prepped his right hip from the abdomen, all the way down to the ankle with DuraPrep and sterile drapes and a sterile stockinette.  A time-out was called and he was identified as correct patient, correct right hip.  I then made incision directly to the greater  trochanter and carried this proximally and distally.  I dissected down all the way to the iliotibial band and found all normal tissue.  We then divided the iliotibial band and then got to the greater trochanter and found no evidence of any infection at this level as well as no fluid collection.  I then took the gluteus medius and minimus off the greater trochanter, and plugged these anterior and dissected down the hip capsule.  We opened up the hip capsule and again found no fluid at all.  We then were able to dislocate the hip and removed the hip ball and first we removed the polyethylene liner from the femur, but left the cup in place, while we could assess the femur.  The femoral component itself was grossly loose.  I was able to take cultures from around the stem, but apparently again found no gross purulence.  There is found definitely found of loosening and a biofilm around the femoral component.  This was fully porous-coated femoral component with a size 19.5, which we then removed without much difficulty.  I then scraped all the biofilm away from inside the femur and used pulsatile lavage with a brush inside the femoral canal, that completely cleaned the femoral canal.  At that point, I felt that the acetabular component was in a good position and it had no motion to it at all.  I could not even remove it.  I was pleased that its position and fell with no evidence of loosening on bone scan and the fact that I am finding gross purulence I felt __________ relieving the cup, the acetabular component in place, specifically was deep with bone ingrown and I can experience there was any biofilm around this.  We then placed a brand new 36+ 4 neutral polyethylene liner for the size 60 cup.  Next, we prepared the DePuy Prostalac from component size 3 with a bow to it.  We mixed our antibiotic cement with tobramycin and vancomycin.  We then performed this around the femoral component and  cemented the temporary femoral component in place.  We placed a 36+ 8 metal hip ball and reduced this in the acetabulum once the cement had dried, and this was stable.  We then irrigated the soft tissue with normal saline solution, and we put a total of 9 L of fluid throughout the hip.  Again, we found normal soft tissue throughout.  I then reapproximated the gluteus medius and minimus back to the greater trochanter, closed the iliotibial band followed by the deep tissue with 0 Vicryl and 2-0 Vicryl in subcutaneous tissue, and staples on the skin.  He was then turned back into supine position, awakened, extubated, and taken to recovery room in stable condition. All final counts were correct.  There were no complications noted. Postoperatively, I still feel that  we should treat this as an infection and delay his 2 stage reimplantation for likely 6 weeks pending what the pathology shows in terms of looking for inflammatory cells as well.  Of note, Richardean CanalGilbert Clark, PA-C assisted during the entire case and his assistance was surgically crucial for this gentleman's case.     Vanita Pandahristopher Y. Magnus IvanBlackman, M.D.     CYB/MEDQ  D:  04/18/2014  T:  04/19/2014  Job:  [id number]

## 2014-04-19 NOTE — Progress Notes (Signed)
Peripherally Inserted Central Catheter/Midline Placement  The IV Nurse has discussed with the patient and/or persons authorized to consent for the patient, the purpose of this procedure and the potential benefits and risks involved with this procedure.  The benefits include less needle sticks, lab draws from the catheter and patient may be discharged home with the catheter.  Risks include, but not limited to, infection, bleeding, blood clot (thrombus formation), and puncture of an artery; nerve damage and irregular heat beat.  Alternatives to this procedure were also discussed.  PICC/Midline Placement Documentation  PICC / Midline Single Lumen 04/19/14 PICC Left Basilic 55 cm 0 cm (Active)  Indication for Insertion or Continuance of Line Home intravenous therapies (PICC only) 04/19/2014 11:39 AM  Exposed Catheter (cm) 0 cm 04/19/2014 11:39 AM  Site Assessment Clean;Dry;Intact 04/19/2014 11:39 AM  Line Status Flushed;Saline locked;Blood return noted 04/19/2014 11:39 AM  Dressing Type Transparent 04/19/2014 11:39 AM  Dressing Status Clean;Dry;Intact;Antimicrobial disc in place 04/19/2014 11:39 AM  Line Care Connections checked and tightened 04/19/2014 11:39 AM  Dressing Intervention New dressing 04/19/2014 11:39 AM  Dressing Change Due 04/26/14 04/19/2014 11:39 AM     PICC / Midline Double Lumen PICC (Active)  Site Assessment Other (Comment) 04/19/2014  7:46 AM  Lumen #1 Status Flushed;Saline locked;Blood return noted 04/19/2014  4:50 AM  Lumen #2 Status Flushed;Saline locked;Blood return noted 04/19/2014  4:50 AM  Dressing Type Transparent 04/19/2014  4:50 AM  Dressing Status Other (Comment) 04/19/2014  4:50 AM  Dressing Change Due 04/19/14 04/19/2014  4:50 AM       Elliot Dally 04/19/2014, 11:40 AM

## 2014-04-19 NOTE — Progress Notes (Signed)
Foley OK Retracted penis with superficial abrasion at tip May remove catheter in 4 or more days on day of d/c home oitment and good hygiene for tip ordered

## 2014-04-19 NOTE — Evaluation (Signed)
Physical Therapy Evaluation Patient Details Name: Jack Barry MRN: [id number] DOB: February 25, 1956 Today's Date: 04/19/2014   History of Present Illness  58 y.o. male s/p EXCISIONAL RIGHT TOTAL HIP ARTHROPLASTY WITH PLACEMENT OF TEMPORARY ANTIBIOTIC SPACERS and CYSTOSCOPY WITH BALLOON DILATION AND RETROGRADE URETHROGRAM. Hx of HTN, diabetes, and a-fib.  Clinical Impression  Pt is seen following the above procedures and presents with the deficits listed below (see PT Problem List). Ambulates up to 10 feet x 2 this AM, however has great difficulty with bed mobility. Recommend SNF for further rehab prior to going home due to limited care available at home. Pt will benefit from skilled PT to increase their independence and safety with mobility.       Follow Up Recommendations SNF;Supervision/Assistance - 24 hour    Equipment Recommendations  None recommended by PT    Recommendations for Other Services       Precautions / Restrictions Precautions Precautions: Anterior Hip Precaution Booklet Issued: Yes (comment) Precaution Comments: reviewed WB status and anterior hip precautions Restrictions Weight Bearing Restrictions: Yes RLE Weight Bearing: Partial weight bearing RLE Partial Weight Bearing Percentage or Pounds: 50      Mobility  Bed Mobility Overal bed mobility: Needs Assistance;+ 2 for safety/equipment Bed Mobility: Supine to Sit     Supine to sit: Min assist;HOB elevated;+2 for safety/equipment     General bed mobility comments: Pt able to safely move LEs while maintaining precautions to edge of bed however was unable to rise from flat surface. Dependent on HOB highly elevated. Able to scoot minimally in seated position on edge of bed. Verbal cues for technique.  Transfers Overall transfer level: Needs assistance Equipment used: Rolling walker (2 wheeled) Transfers: Sit to/from Stand Sit to Stand: Min assist;+2 physical assistance;From elevated surface          General transfer comment: Sit<>stand with min assist for boost from elevated bed and from recliner at min guard level. Verbal cues for hand and foot placement. tactile cues to lean forward. Good stability upon standing and maintains 50% WB on RLE.  Ambulation/Gait Ambulation/Gait assistance: Min assist;+2 safety/equipment Ambulation Distance (Feet): 10 Feet (x2) Assistive device: Rolling walker (2 wheeled) Gait Pattern/deviations: Step-to pattern;Decreased step length - left;Decreased stance time - right;Decreased dorsiflexion - right;Antalgic Gait velocity: decreased   General Gait Details: Min assist for RW control and second person to follow with chair. verbal cues for sequencing and walker positioning. Safely maintains 50% WB status on RLE. Cues for UE use to increase LLE clearance with each step. Able to perform 2 bouts of 10 feet each with a seated rest break inbetween.  Stairs            Wheelchair Mobility    Modified Rankin (Stroke Patients Only)       Balance Overall balance assessment: Needs assistance Sitting-balance support: No upper extremity supported;Feet supported Sitting balance-Leahy Scale: Fair Sitting balance - Comments: sits EOB without assist   Standing balance support: Single extremity supported Standing balance-Leahy Scale: Poor Standing balance comment: Able to stand safely with 1 UE on rolling walker                             Pertinent Vitals/Pain Pt reports pain as "pretty good" no numerical value given. Patient repositioned in chair for comfort.     Home Living Family/patient expects to be discharged to:: Skilled nursing facility Living Arrangements: Spouse/significant other Available Help at Discharge: Family;Available PRN/intermittently Type  of Home: House Home Access: Stairs to enter Entrance Stairs-Rails: None Entrance Stairs-Number of Steps: 2 Home Layout: One level Home Equipment: Walker - 2 wheels;Bedside  commode;Tub bench      Prior Function Level of Independence: Needs assistance   Gait / Transfers Assistance Needed: rolling walker  ADL's / Homemaking Assistance Needed: wife assisted with bath/dress  Comments: used rolling walker for ambulation intermittently     Hand Dominance   Dominant Hand: Right    Extremity/Trunk Assessment   Upper Extremity Assessment: Defer to OT evaluation           Lower Extremity Assessment: RLE deficits/detail RLE Deficits / Details: decreased strength and ROM - difficult to maintain neutral hip position in sitting- needs block from pillows to wedge in place in order to maintain anterior hip precautions       Communication   Communication: No difficulties  Cognition Arousal/Alertness: Awake/alert Behavior During Therapy: WFL for tasks assessed/performed Overall Cognitive Status: Within Functional Limits for tasks assessed                      General Comments General comments (skin integrity, edema, etc.): Reviewed direct anterior precautions and 50% weight bearing status on RLE. Hemosiderin staining bilaterally. Needs pillow wedged between right lateral knee and arm rest of chair to prevent external rotation of RLE.    Exercises Total Joint Exercises Ankle Circles/Pumps: AROM;Both;10 reps;Seated Gluteal Sets: Strengthening;Both;5 reps;Seated      Assessment/Plan    PT Assessment Patient needs continued PT services  PT Diagnosis Difficulty walking;Abnormality of gait;Acute pain   PT Problem List Decreased strength;Decreased range of motion;Decreased activity tolerance;Decreased balance;Decreased mobility;Decreased knowledge of use of DME;Decreased knowledge of precautions;Obesity;Pain  PT Treatment Interventions DME instruction;Gait training;Functional mobility training;Therapeutic activities;Therapeutic exercise;Balance training;Neuromuscular re-education;Patient/family education;Modalities;Stair training   PT Goals (Current  goals can be found in the Care Plan section) Acute Rehab PT Goals Patient Stated Goal: Go home PT Goal Formulation: With patient Time For Goal Achievement: 04/26/14 Potential to Achieve Goals: Good    Frequency 7X/week   Barriers to discharge Decreased caregiver support Wife works during the day.    Co-evaluation               End of Session   Activity Tolerance: Patient tolerated treatment well Patient left: in chair;with call bell/phone within reach Nurse Communication: Mobility status         Time: [id number] PT Time Calculation (min): 37 min   Charges:   PT Evaluation $Initial PT Evaluation Tier I: 1 Procedure PT Treatments $Gait Training: 8-22 mins $Therapeutic Activity: 8-22 mins   PT G CodesSunday Spillers:         Annisten [place] Secor [place], [name] [phone]  Berton MountLogan S Lafonda Patron 04/19/2014, 10:59 AM

## 2014-04-19 NOTE — Clinical Social Work Placement (Signed)
Clinical Social Work Department  CLINICAL SOCIAL WORK PLACEMENT NOTE  Patient: Jack Barry Account Number: [id number] Admit date: 04/17/14  Clinical Social Worker: [name] LCSWA Date/time: 04/19/2014 2:30 PM Clinical Social Work is seeking post-discharge placement for this patient at the following level of care: SKILLED NURSING (*CSW will update this form in Epic as items are completed)  04/19/2014 Patient/family provided with [facility] Department of Clinical Social Work's list of facilities offering this level of care within the geographic area requested by the patient (or if unable, by the patient's family).  04/19/2014 Patient/family informed of their freedom to choose among providers that offer the needed level of care, that participate in Medicare, Medicaid or managed care program needed by the patient, have an available bed and are willing to accept the patient.  04/19/2014 Patient/family informed of [facility]' ownership interest in [facility], as well as of the fact that they are under no obligation to receive care at this facility.  PASARR submitted to EDS on  PASARR number received from EDS on  FL2 transmitted to all facilities in geographic area requested by pt/family on 04/19/2014  FL2 transmitted to all facilities within larger geographic area on  Patient informed that his/her managed care company has contracts with or will negotiate with certain facilities, including the following:  Patient/family informed of bed offers received:  Patient chooses bed at  Physician recommends and patient chooses bed at  Patient to be transferred to on  Patient to be transferred to facility by  The following physician request were entered in Epic:  Additional Comments:

## 2014-04-19 NOTE — Progress Notes (Signed)
Subjective: 1 Day Post-Op Procedure(s) (LRB): EXCISIONAL RIGHT TOTAL HIP ARTHROPLASTY WITH PLACEMENT OF TEMPORARY ANTIBIOTIC SPACERS (Right) CYSTOSCOPY WITH BALLOON DILATION AND RETROGRADE URETHROGRAM (N/A) BALLOON DILATION (N/A) Patient reports pain as moderate.    Objective: Vital signs in last 24 hours: Temp:  [97.5 F (36.4 C)-98.6 F (37 C)] 97.8 F (36.6 C) (06/03 0559) Pulse Rate:  [59-78] 64 (06/03 0559) Resp:  [17-20] 18 (06/03 0559) BP: (117-162)/(54-86) 117/57 mmHg (06/03 0559) SpO2:  [90 %-96 %] 94 % (06/03 0559) Weight:  [163.295 kg (360 lb)] 163.295 kg (360 lb) (06/02 1354)  Intake/Output from previous day: 06/02 0701 - 06/03 0700 In: 3400 [P.O.:400; I.V.:2500; IV Piggyback:500] Out: 1500 [Urine:200; Blood:1300] Intake/Output this shift:     Recent Labs  04/18/14 1301 04/18/14 1950 04/19/14 0450  HGB 14.2 11.9* 9.6*    Recent Labs  04/18/14 1301 04/18/14 1950 04/19/14 0450  WBC 8.8  --  13.3*  RBC 4.93  --  3.42*  HCT 43.1 35.0* 30.2*  PLT 323  --  310    Recent Labs  04/18/14 1301 04/18/14 1950 04/19/14 0450  NA 137 140 137  K 4.3 4.2 5.0  CL 98  --  100  CO2 28  --  29  BUN 18  --  24*  CREATININE 1.08  --  1.22  GLUCOSE 130* 143* 161*  CALCIUM 9.4  --  8.3*    Recent Labs  04/18/14 1301  INR 1.11    Sensation intact distally Intact pulses distally Dorsiflexion/Plantar flexion intact Incision: scant drainage  Assessment/Plan: 1 Day Post-Op Procedure(s) (LRB): EXCISIONAL RIGHT TOTAL HIP ARTHROPLASTY WITH PLACEMENT OF TEMPORARY ANTIBIOTIC SPACERS (Right) CYSTOSCOPY WITH BALLOON DILATION AND RETROGRADE URETHROGRAM (N/A) BALLOON DILATION (N/A) Up with therapy, only 50% weight on right hip with anterior hip precautions  Jack Barry 04/19/2014, 7:17 AM

## 2014-04-19 NOTE — Progress Notes (Signed)
Patient ID: Jack Barry, male   DOB: July 17, 1956, 58 y.o.   MRN: [id number] Nursing called indicating that the patient was concerned that his hip might dislocate with too much external rotation. Requested a foot foam stabilizer used to stabilize foot in neutral internal and external leg rotation. Agreed with the use but explained that it is rare for a anterior approach surgery of the hip to have a dislocation so the patient can be reassured.

## 2014-04-19 NOTE — Progress Notes (Signed)
Patient has PICC line located in the right upper arm. Lab tech informed RN that he could not draw basal metabolic panel and CBC from PICC line. Patient refused lab tech to withdraw any blood from any other site. RN paged the IV team. The IV RN Cherly Hensen came to the patients bedside to withdraw BMP and CBC. Bernadette informed patients RN that the PICC line site appears infected. The PICC line site is pink and tender with crust residue present as well. IV RN Cherly Hensen informed patients RN that it is pertinent that the PICC site is not used until further assessment by the MD. Cherly Hensen RN was however able to draw the BMP and CBC for 04/19/14 labs. Will pass on this information to the patients day shift nurse.

## 2014-04-19 NOTE — Progress Notes (Signed)
Orthopedic Tech Progress Note Patient Details:  Jack Barry Apr 13, 1956 [id number]  Ortho Devices Ortho Device/Splint Location: trapeze bar patient helper Ortho Device/Splint Interventions: Application   Murray Guzzetta 04/19/2014, 1:43 PM

## 2014-04-19 NOTE — Clinical Social Work Psychosocial (Signed)
Clinical Social Work Department  BRIEF PSYCHOSOCIAL ASSESSMENT  Patient:Jack Barry Account Number: [id number]   Admit date: 04/18/14 Clinical Social Worker Rhea Pink, MSW Date/Time: 04/19/2014 4:00 PM Referred by: Physician Date Referred: 04/18/14 Referred for   SNF Placement   Other Referral:  Interview type: Patient at bedside Other interview type: PSYCHOSOCIAL DATA  Living Status:with spouse at private residence in [facility] Admitted from facility:  Level of care:  Primary support name: Jack Barry Primary support relationship to patient: Wife Degree of support available:  Strong and vested  CURRENT CONCERNS  Current Concerns   Post-Acute Placement   Other Concerns:  SOCIAL WORK ASSESSMENT / PLAN  CSW met with pt re: PT recommendation for SNF.   Pt lives with his spouse  CSW explained placement process and answered questions.   Pt reports no preference at this time   CSW completed FL2 and initiated SNF search.     Assessment/plan status: Information/Referral to [employer]  Other assessment/ plan:  Information/referral to community resources:  SNF   PTAR  PATIENT'S/FAMILY'S RESPONSE TO PLAN OF CARE:  Pt  reports he is agreeable to ST SNF in order to increase strength and independence with mobility prior to returning home  Pt verbalized understanding of placement process and appreciation for CSW assist.   Rhea Pink, MSW, [place]

## 2014-04-20 LAB — CBC
HCT: 25 % — ABNORMAL LOW (ref 39.0–52.0)
HEMOGLOBIN: 8.2 g/dL — AB (ref 13.0–17.0)
MCH: 28.6 pg (ref 26.0–34.0)
MCHC: 32.8 g/dL (ref 30.0–36.0)
MCV: 87.1 fL (ref 78.0–100.0)
Platelets: 267 10*3/uL (ref 150–400)
RBC: 2.87 MIL/uL — ABNORMAL LOW (ref 4.22–5.81)
RDW: 16.2 % — AB (ref 11.5–15.5)
WBC: 10.7 10*3/uL — ABNORMAL HIGH (ref 4.0–10.5)

## 2014-04-20 LAB — BASIC METABOLIC PANEL
BUN: 41 mg/dL — ABNORMAL HIGH (ref 6–23)
CO2: 27 mEq/L (ref 19–32)
Calcium: 7.8 mg/dL — ABNORMAL LOW (ref 8.4–10.5)
Chloride: 100 mEq/L (ref 96–112)
Creatinine, Ser: 2.56 mg/dL — ABNORMAL HIGH (ref 0.50–1.35)
GFR calc Af Amer: 31 mL/min — ABNORMAL LOW (ref >90)
GFR, EST NON AFRICAN AMERICAN: 26 mL/min — AB (ref >90)
Glucose, Bld: 137 mg/dL — ABNORMAL HIGH (ref 70–99)
POTASSIUM: 3.9 meq/L (ref 3.7–5.3)
SODIUM: 136 meq/L — AB (ref 137–147)

## 2014-04-20 LAB — GLUCOSE, CAPILLARY
Glucose-Capillary: 178 mg/dL — ABNORMAL HIGH (ref 70–99)
Glucose-Capillary: 129 mg/dL — ABNORMAL HIGH (ref 70–99)
Glucose-Capillary: 78 mg/dL (ref 70–99)
Glucose-Capillary: 107 mg/dL — ABNORMAL HIGH (ref 70–99)

## 2014-04-20 LAB — PREPARE RBC (CROSSMATCH)

## 2014-04-20 LAB — VANCOMYCIN, TROUGH: Vancomycin Tr: 29.7 ug/mL (ref 10.0–20.0)

## 2014-04-20 MED ORDER — INSULIN ASPART 100 UNIT/ML ~~LOC~~ SOLN
0.0000 [IU] | Freq: Three times a day (TID) | SUBCUTANEOUS | Status: DC
  Administered 2014-04-20: 2 [IU] via SUBCUTANEOUS
  Administered 2014-04-22 (×2): 3 [IU] via SUBCUTANEOUS
  Administered 2014-04-23 – 2014-04-24 (×3): 2 [IU] via SUBCUTANEOUS

## 2014-04-20 MED ORDER — FUROSEMIDE 10 MG/ML IJ SOLN
20.0000 mg | Freq: Once | INTRAMUSCULAR | Status: AC
  Administered 2014-04-20: 20 mg via INTRAVENOUS
  Filled 2014-04-20: qty 2

## 2014-04-20 NOTE — Progress Notes (Signed)
CSW continuing to work on finding a bed for patient. Clinicals have been faxed to [facility] in [place].  [name], MSW, [employer] [phone]

## 2014-04-20 NOTE — Progress Notes (Signed)
ANTIBIOTIC CONSULT NOTE - FOLLOW UP  Pharmacy Consult for Vancomycin and Cefepime Indication: chronic prosthetic hip joint infection  Allergies  Allergen Reactions  . Ciprofloxacin Itching  . Sulfa Antibiotics   . Tape Rash    Can use paper tape    Patient Measurements: Height: 6\' 2"  (188 cm) Weight: 360 lb (163.295 kg) IBW/kg (Calculated) : 82.2  Vital Signs: Temp: 98.2 F (36.8 C) (06/04 1712) Temp src: Oral (06/04 1712) BP: 120/57 mmHg (06/04 1712) Pulse Rate: 56 (06/04 1712) Intake/Output from previous day: 06/03 0701 - 06/04 0700 In: 1830 [I.V.:1630; IV Piggyback:200] Out: 175 [Urine:175] Intake/Output from this shift:    Labs:  Recent Labs  04/18/14 1301 04/18/14 1950 04/19/14 0450 04/20/14 0503  WBC 8.8  --  13.3* 10.7*  HGB 14.2 11.9* 9.6* 8.2*  PLT 323  --  310 267  CREATININE 1.08  --  1.22 2.56*   Estimated Creatinine Clearance: 52.2 ml/min (by C-G formula based on Cr of 2.56).  Recent Labs  04/20/14 1854  VANCOTROUGH 29.7*     Microbiology: Recent Results (from the past 720 hour(s))  SURGICAL PCR SCREEN     Status: None   Collection Time    04/18/14  2:16 PM      Result Value Ref Range Status   MRSA, PCR NEGATIVE  NEGATIVE Final   Staphylococcus aureus NEGATIVE  NEGATIVE Final   Comment:            The Xpert SA Assay (FDA     approved for NASAL specimens     in patients over 4 years of age),     is one component of     a comprehensive surveillance     program.  Test performance has     been validated by [employer] for patients greater     than or equal to 21 year old.     It is not intended     to diagnose infection nor to     guide or monitor treatment.  WOUND CULTURE     Status: None   Collection Time    04/18/14  7:02 PM      Result Value Ref Range Status   Specimen Description WOUND HIP RIGHT   Final   Special Requests     Final   Value: PATIENT ON FOLLOWING VANCOMYCIN, ANCEF, CEFEPIME FEMORAL COMPONENET SWAB   Gram  Stain     Final   Value: NO WBC SEEN     NO SQUAMOUS EPITHELIAL CELLS SEEN     NO ORGANISMS SEEN     Performed at [facility]   Culture     Final   Value: NO GROWTH 2 DAYS     Performed at [facility]   Report Status PENDING   Incomplete  ANAEROBIC CULTURE     Status: None   Collection Time    04/18/14  7:02 PM      Result Value Ref Range Status   Specimen Description WOUND HIP RIGHT   Final   Special Requests     Final   Value: PATIENT ON FOLLOWING VANCOMYCIN, ANCEF, CEFEPIME FEMORAL COMPONENET SWAB   Gram Stain     Final   Value: NO WBC SEEN     NO SQUAMOUS EPITHELIAL CELLS SEEN     NO ORGANISMS SEEN     Performed at [facility]   Culture     Final   Value: NO ANAEROBES  ISOLATED; CULTURE IN PROGRESS FOR 5 DAYS     [facility]   Report Status PENDING   Incomplete  AFB CULTURE WITH SMEAR     Status: None   Collection Time    04/18/14  7:02 PM      Result Value Ref Range Status   Specimen Description WOUND HIP RIGHT   Final   Special Requests     Final   Value: PATIENT ON FOLLOWING VANCOMYCIN, ANCEF, CEFEPIME FEMORAL COMPONENET   Acid Fast Smear     Final   Value: NO ACID FAST BACILLI SEEN     [facility]formed at [facility]   Culture     Final   Value: CULTURE WILL BE EXAMINED FOR 6 WEEKS BEFORE ISSUING A FINAL REPORT     [facility]formed at [facility]   Report Status PENDING   Incomplete   Assessment: 58yom s/p right THA with placement of temporary antibiotic spacers continues on  vancomycin and cefepime (from [facility]) for a chronic prosthetic hip joint infection. Vancomycin trough drawn this evening is above goal at 29.7. Not surprising as patient's renal function has severely declined over the last several days (sCr 1.08-->1.22-->2.56). Vancomycin half-life is now ~22 hours so he will not need another dose until at least tomorrow morning, however, will check a random level in the morning to ensure it is not accumulating  further.  Goal of Therapy:  Vancomycin trough level 15-20 mcg/ml  Plan:  1) Hold vancomycin for now 2) Random vancomycin level tomorrow morning 3) Continue cefepime 2g IV q8  Hessie DienerJennifer Sue Seymour Pavlak 04/20/2014,7:55 PM

## 2014-04-20 NOTE — Progress Notes (Signed)
Late entry -After explaining to pt what MD had discussed, pt opt not to used a foam stabilizer, pt's leg remain in neutral position without any external or internal rotation. Pt resting comfortably.

## 2014-04-20 NOTE — Care Management Note (Signed)
CARE MANAGEMENT NOTE 04/20/2014  Patient:  HITESH, CLARKIN   Account Number:  [id number]  Date Initiated:  04/20/2014  Documentation initiated by:  Vance Peper  Subjective/Objective Assessment:   58 yr old male s/p right total hip arthroplasty sewcondary to infection..     Action/Plan:   patient will require shortterm rehab at SNF. Patient has PICC line for continued IV antibiotics. Social worker is working with patient and wife on facility.   Anticipated DC Date:  04/20/2014   Anticipated DC Plan:  SKILLED NURSING FACILITY  In-house referral  Clinical Social Worker      DC Planning Services  CM consult      Choice offered to / List presented to:             Status of service:  Completed, signed off Medicare Important Message given?   (If response is "NO", the following Medicare IM given date fields will be blank) Date Medicare IM given:   Date Additional Medicare IM given:    Discharge Disposition:  SKILLED NURSING FACILITY  Per UR Regulation:  Reviewed for med. necessity/level of care/duration of stay  If discussed at Long Length of Stay Meetings, dates discussed:    Comments:

## 2014-04-20 NOTE — Clinical Documentation Improvement (Signed)
Please clarify obesity. Thank you.  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 Because of his obesity etc. it was difficult to grab the head of the penis and put penis on stretch. The patient is very obese   Sign & Symptoms: Height  6'2"    Weight  360 lbs   BMI  46.3  Thank You, Harless Litten ,RN Clinical Documentation Specialist:  [phone]  [employer] [facility]- Health Information Management

## 2014-04-20 NOTE — Progress Notes (Signed)
Physical Therapy Treatment Patient Details Name: Jack Barry MRN: [id number] DOB: Apr 04, 1956 Today's Date: 04/20/2014    History of Present Illness 58 y.o. male s/p EXCISIONAL RIGHT TOTAL HIP ARTHROPLASTY WITH PLACEMENT OF TEMPORARY ANTIBIOTIC SPACERS and CYSTOSCOPY WITH BALLOON DILATION AND RETROGRADE URETHROGRAM. Hx of HTN, diabetes, and a-fib.    PT Comments    Patient is progressing well towards physical therapy goals, ambulating up to 15 feet today with min guard for safety while using rolling walker and safely maintaining 50% WB status on RLE. Discussed my recommendation for SNF placement for further rehab, pt states he understands however, reports he would like more information on the available facilities around his hometown. Case management notified. Patient will continue to benefit from skilled physical therapy services to further improve independence with functional mobility.   Follow Up Recommendations  SNF;Supervision/Assistance - 24 hour     Equipment Recommendations  None recommended by PT    Recommendations for Other Services       Precautions / Restrictions Precautions Precautions: Anterior Hip Precaution Booklet Issued: Yes (comment) Precaution Comments: reviewed WB status and anterior hip precautions Restrictions Weight Bearing Restrictions: Yes RLE Weight Bearing: Partial weight bearing RLE Partial Weight Bearing Percentage or Pounds: 50    Mobility  Bed Mobility Overal bed mobility: Needs Assistance;+ 2 for safety/equipment Bed Mobility: Supine to Sit     Supine to sit: Min assist;HOB elevated;+2 for safety/equipment     General bed mobility comments: Lowered HOB slightly from upright position today to focus on increased independence with bed mobility. Pt demonstrated good control of LEs and was able to pull himself to sitting position from approximately 45 degrees of HOB angle. Relies heavily on rail and needs ques to breath adequately as he holds  his breath for an extended period of time while performing this task. Once in sitting at EOB he was able to scoot his hips to the right and forward without assitance.  Transfers Overall transfer level: Needs assistance Equipment used: Rolling walker (2 wheeled) Transfers: Sit to/from Stand Sit to Stand: Min assist;+2 safety/equipment         General transfer comment: Min assist from lowest bed setting for boost with verbal cues for hand placement and technique. Pt unable to lean forward adequately due to body habitus and tends to twist towards the left while pushing up from rail with left hand.  Ambulation/Gait Ambulation/Gait assistance: Min guard Ambulation Distance (Feet): 15 Feet Assistive device: Rolling walker (2 wheeled) Gait Pattern/deviations: Step-to pattern;Decreased step length - left;Decreased stance time - right;Antalgic Gait velocity: decreased   General Gait Details: Min guard with improved control of rolling walker today. Intermittent verbal cues for sequencing. Demonstrates good posture and ability to safely maintain 50% WB status on RLE. Pt very fatigued after distance of 15 feet and needed to sit.  Able to perform standing balance for 2 minutes with UE support on rolling walker and safely maintained 50% WB status   Stairs            Wheelchair Mobility    Modified Rankin (Stroke Patients Only)       Balance                                    Cognition Arousal/Alertness: Awake/alert Behavior During Therapy: WFL for tasks assessed/performed Overall Cognitive Status: Within Functional Limits for tasks assessed  Exercises Total Joint Exercises Ankle Circles/Pumps: AROM;Both;10 reps;Seated Gluteal Sets: Strengthening;Both;Seated;10 reps    General Comments General comments (skin integrity, edema, etc.): Discussed d/c planning and recommended SNF placement. Pt is unsure and states he is still deciding between  SNF and home. RLE positioned in neutral.      Pertinent Vitals/Pain Pt reports pain as minimal currently. Patient repositioned in chair for comfort.     Home Living                      Prior Function            PT Goals (current goals can now be found in the care plan section) Acute Rehab PT Goals Patient Stated Goal: Go home PT Goal Formulation: With patient Time For Goal Achievement: 04/26/14 Potential to Achieve Goals: Good Progress towards PT goals: Progressing toward goals    Frequency  7X/week    PT Plan Current plan remains appropriate    Co-evaluation             End of Session Equipment Utilized During Treatment: Gait belt Activity Tolerance: Patient tolerated treatment well Patient left: in chair;with call bell/phone within reach     Time: 0940-1003 PT Time Calculation (min): 23 min  Charges:  $Gait Training: 8-22 mins $Therapeutic Activity: 8-22 mins                    G CodesSunday Spillers:      Devon Pretty [place]or [place], [name] [phone]  [place] MountLogan S Alma Mohiuddin 04/20/2014, 10:54 AM

## 2014-04-20 NOTE — Progress Notes (Signed)
Jack Barry accepted patient and has started insurance authorization.  Family is OK with this bed offer.  [name], MSW, [employer] [phone]

## 2014-04-20 NOTE — Progress Notes (Signed)
Pt will self administer CPAP when ready.  Current settings are 10 CMH20 with 2 LPM O2 bleed in via FFM.  RT to monitor and assess as needed.

## 2014-04-20 NOTE — Progress Notes (Signed)
Subjective: 2 Days Post-Op Procedure(s) (LRB): EXCISIONAL RIGHT TOTAL HIP ARTHROPLASTY WITH PLACEMENT OF TEMPORARY ANTIBIOTIC SPACERS (Right) CYSTOSCOPY WITH BALLOON DILATION AND RETROGRADE URETHROGRAM (N/A) BALLOON DILATION (N/A) Patient reports pain as moderate.  Acute blood loss anemia from surgery.  Now creatinine increased significantly likely from blood loss.  Has been on Vanc for awhile and could be from this as well.  Objective: Vital signs in last 24 hours: Temp:  [97.6 F (36.4 C)-98 F (36.7 C)] 98 F (36.7 C) (06/04 0521) Pulse Rate:  [55-68] 55 (06/04 0521) Resp:  [16-18] 16 (06/04 0521) BP: (88-139)/(34-54) 139/54 mmHg (06/04 0521) SpO2:  [90 %-99 %] 99 % (06/04 0521)  Intake/Output from previous day: 06/03 0701 - 06/04 0700 In: 1830 [I.V.:1630; IV Piggyback:200] Out: 175 [Urine:175] Intake/Output this shift: Total I/O In: 240 [P.O.:240] Out: -    Recent Labs  04/18/14 1301 04/18/14 1950 04/19/14 0450 04/20/14 0503  HGB 14.2 11.9* 9.6* 8.2*    Recent Labs  04/19/14 0450 04/20/14 0503  WBC 13.3* 10.7*  RBC 3.42* 2.87*  HCT 30.2* 25.0*  PLT 310 267    Recent Labs  04/19/14 0450 04/20/14 0503  NA 137 136*  K 5.0 3.9  CL 100 100  CO2 29 27  BUN 24* 41*  CREATININE 1.22 2.56*  GLUCOSE 161* 137*  CALCIUM 8.3* 7.8*    Recent Labs  04/18/14 1301  INR 1.11    Sensation intact distally Intact pulses distally Dorsiflexion/Plantar flexion intact Incision: scant drainage  Assessment/Plan: 2 Days Post-Op Procedure(s) (LRB): EXCISIONAL RIGHT TOTAL HIP ARTHROPLASTY WITH PLACEMENT OF TEMPORARY ANTIBIOTIC SPACERS (Right) CYSTOSCOPY WITH BALLOON DILATION AND RETROGRADE URETHROGRAM (N/A) BALLOON DILATION (N/A) Up with therapy Will transfuse 2 units of blood today.  Kathryne Hitch 04/20/2014, 9:50 AM

## 2014-04-21 ENCOUNTER — Encounter (HOSPITAL_COMMUNITY): Payer: Self-pay | Admitting: Orthopaedic Surgery

## 2014-04-21 LAB — WOUND CULTURE
Culture: NO GROWTH
Gram Stain: NONE SEEN

## 2014-04-21 LAB — GLUCOSE, CAPILLARY
GLUCOSE-CAPILLARY: 107 mg/dL — AB (ref 70–99)
GLUCOSE-CAPILLARY: 117 mg/dL — AB (ref 70–99)
Glucose-Capillary: 106 mg/dL — ABNORMAL HIGH (ref 70–99)
Glucose-Capillary: 163 mg/dL — ABNORMAL HIGH (ref 70–99)

## 2014-04-21 LAB — BASIC METABOLIC PANEL
BUN: 42 mg/dL — ABNORMAL HIGH (ref 6–23)
CALCIUM: 8.1 mg/dL — AB (ref 8.4–10.5)
CO2: 26 mEq/L (ref 19–32)
Chloride: 99 mEq/L (ref 96–112)
Creatinine, Ser: 2.55 mg/dL — ABNORMAL HIGH (ref 0.50–1.35)
GFR calc Af Amer: 31 mL/min — ABNORMAL LOW (ref >90)
GFR, EST NON AFRICAN AMERICAN: 27 mL/min — AB (ref >90)
GLUCOSE: 107 mg/dL — AB (ref 70–99)
Potassium: 4.3 mEq/L (ref 3.7–5.3)
Sodium: 134 mEq/L — ABNORMAL LOW (ref 137–147)

## 2014-04-21 LAB — VANCOMYCIN, RANDOM
VANCOMYCIN RM: 16.6 ug/mL
Vancomycin Rm: 25.3 ug/mL

## 2014-04-21 LAB — TYPE AND SCREEN
ABO/RH(D): A POS
Antibody Screen: NEGATIVE
UNIT DIVISION: 0
Unit division: 0

## 2014-04-21 LAB — CBC
HEMATOCRIT: 27.2 % — AB (ref 39.0–52.0)
HEMOGLOBIN: 9 g/dL — AB (ref 13.0–17.0)
MCH: 28.4 pg (ref 26.0–34.0)
MCHC: 33.1 g/dL (ref 30.0–36.0)
MCV: 85.8 fL (ref 78.0–100.0)
Platelets: 229 10*3/uL (ref 150–400)
RBC: 3.17 MIL/uL — ABNORMAL LOW (ref 4.22–5.81)
RDW: 15.7 % — ABNORMAL HIGH (ref 11.5–15.5)
WBC: 9.5 10*3/uL (ref 4.0–10.5)

## 2014-04-21 MED ORDER — SODIUM CHLORIDE 0.9 % IV SOLN
2000.0000 mg | INTRAVENOUS | Status: DC
  Administered 2014-04-22 – 2014-04-23 (×2): 2000 mg via INTRAVENOUS
  Filled 2014-04-21 (×2): qty 2000

## 2014-04-21 NOTE — Progress Notes (Signed)
Patient ID: Jack Barry, male   DOB: [birth year], 57 y.o.   MRN: [id number] Foley to stay in today to monitor urine output

## 2014-04-21 NOTE — Progress Notes (Addendum)
ANTIBIOTIC CONSULT NOTE - FOLLOW UP  Pharmacy Consult:  Vancomycin, Cefepime Indication: Chronic prosthetic hip joint infection  Dosing Weight: 163 kg  Afebrile  97.8 F (36.6 C) (Oral)   , Lab Results  Component Value Date   WBC 9.5 04/21/2014    Labs:  Recent Labs  04/19/14 0450 04/20/14 0503 04/21/14 0457  WBC 13.3* 10.7* 9.5  HGB 9.6* 8.2* 9.0*  PLT 310 267 229  CREATININE 1.22 2.56* 2.55*    Estimated Creatinine Clearance: 52.4 ml/min (by C-G formula based on Cr of 2.55).   Recent Labs  04/20/14 1854 04/21/14 0457  VANCOTROUGH 29.7* 25   Microbiology: Recent Results (from the past 720 hour(s))  SURGICAL PCR SCREEN     Status: None   Collection Time    04/18/14  2:16 PM      Result Value Ref Range Status   MRSA, PCR NEGATIVE  NEGATIVE Final   Staphylococcus aureus NEGATIVE  NEGATIVE Final   Comment:            The Xpert SA Assay (FDA     approved for NASAL specimens     in patients over 47 years of age),     is one component of     a comprehensive surveillance     program.  Test performance has     been validated by [employer] for patients greater     than or equal to 92 year old.     It is not intended     to diagnose infection nor to     guide or monitor treatment.  WOUND CULTURE     Status: None   Collection Time    04/18/14  7:02 PM      Result Value Ref Range Status   Specimen Description WOUND HIP RIGHT   Final   Special Requests     Final   Value: PATIENT ON FOLLOWING VANCOMYCIN, ANCEF, CEFEPIME FEMORAL COMPONENET SWAB   Gram Stain     Final   Value: NO WBC SEEN     NO SQUAMOUS EPITHELIAL CELLS SEEN     NO ORGANISMS SEEN     Performed at [facility]   Culture     Final   Value: NO GROWTH 2 DAYS     Performed at [facility]   Report Status 04/21/2014 FINAL   Final  ANAEROBIC CULTURE     Status: None   Collection Time    04/18/14  7:02 PM      Result Value Ref Range Status   Specimen Description WOUND HIP RIGHT    Final   Special Requests     Final   Value: PATIENT ON FOLLOWING VANCOMYCIN, ANCEF, CEFEPIME FEMORAL COMPONENET SWAB   Gram Stain     Final   Value: NO WBC SEEN     NO SQUAMOUS EPITHELIAL CELLS SEEN     NO ORGANISMS SEEN     Performed at [facility]   Culture     Final   Value: NO ANAEROBES ISOLATED; CULTURE IN PROGRESS FOR 5 DAYS     Performed at [facility]   Report Status PENDING   Incomplete  AFB CULTURE WITH SMEAR     Status: None   Collection Time    04/18/14  7:02 PM      Result Value Ref Range Status   Specimen Description WOUND HIP RIGHT   Final   Special Requests  Final   Value: PATIENT ON FOLLOWING VANCOMYCIN, ANCEF, CEFEPIME FEMORAL COMPONENET   Acid Fast Smear     Final   Value: NO ACID FAST BACILLI SEEN     [facility]formed at [facility]   Culture     Final   Value: CULTURE WILL BE EXAMINED FOR 6 WEEKS BEFORE ISSUING A FINAL REPORT     [facility]formed at [facility]   Report Status PENDING   Incomplete    Current Medication[s] Include:  Scheduled:  Scheduled:  . amLODipine  5 mg Oral Daily  . aspirin EC  325 mg Oral BID PC  . buPROPion  150 mg Oral Daily  . carvedilol  6.25 mg Oral BID WC  . ceFEPIme (MAXIPIME) 2 GM IVP  2 g Intravenous 3 times per day  . citalopram  40 mg Oral Daily  . diltiazem  180 mg Oral BID  . docusate sodium  100 mg Oral BID  . ferrous sulfate  325 mg Oral TID PC  . gemfibrozil  600 mg Oral BID AC  . insulin aspart  0-15 Units Subcutaneous TID WC  . insulin aspart  10 Units Subcutaneous TID WC  . insulin detemir  50 Units Subcutaneous Daily  . neomycin-bacitracin-polymyxin   Topical BID  . nitroGLYCERIN  0.2 mg Transdermal Daily  . OxyCODONE  40 mg Oral Q12H  . pantoprazole  40 mg Oral Daily  . sotalol  120 mg Oral BID    Infusion[s]: Infusions:  . sodium chloride 75 mL/hr (04/19/14 0017)    Antibiotic[s]: Anti-infectives   Start     Dose/Rate Route Frequency Ordered Stop   04/19/14 0400   vancomycin (VANCOCIN) 1,500 mg in sodium chloride 0.9 % 500 mL IVPB  Status:  Discontinued     1,500 mg 250 mL/hr over 120 Minutes Intravenous Every 12 hours 04/18/14 2256 04/20/14 1955   04/18/14 2230  ceFEPIme (MAXIPIME) 2 g in dextrose 5 % 50 mL IVPB     2 g 100 mL/hr over 30 Minutes Intravenous 3 times per day 04/18/14 2226     04/18/14 1829  vancomycin (VANCOCIN) powder  Status:  Discontinued       As needed 04/18/14 1830 04/18/14 2125   04/18/14 1828  tobramycin (NEBCIN) powder  Status:  Discontinued       As needed 04/18/14 1829 04/18/14 2125   04/18/14 1500  ceFEPIme (MAXIPIME) 2 g in dextrose 5 % 50 mL IVPB     2 g 100 mL/hr over 30 Minutes Intravenous STAT 04/18/14 1428 04/18/14 1612   04/18/14 1500  vancomycin (VANCOCIN) 1,500 mg in sodium chloride 0.9 % 500 mL IVPB     1,500 mg 250 mL/hr over 120 Minutes Intravenous STAT 04/18/14 1434 04/18/14 1752   04/18/14 1500  tobramycin (NEBCIN) powder 7.2 g  Status:  Discontinued     7.2 g Topical To Surgery 04/18/14 1453 04/18/14 2226   04/18/14 1445  tobramycin (NEBCIN) powder 8.4 g  Status:  Discontinued     8.4 g Topical To Surgery 04/18/14 1444 04/18/14 1452   04/18/14 1445  vancomycin (VANCOCIN) powder 2,000 mg  Status:  Discontinued     2,000 mg Other To Surgery 04/18/14 1445 04/18/14 2226   04/18/14 0600  ceFAZolin (ANCEF) 3 g in dextrose 5 % 50 mL IVPB     3 g 160 mL/hr over 30 Minutes Intravenous On call to O.R. 04/17/14 1408 04/18/14 2024      Assessment:  58 y/o male  on Vancomycin and Cefepime for a prosthetic hip joint infection planned for 6 weeks of duration.  Vancomycin trough last PM was 29.7 mcg/ml.  Vancomycin was placed on hold.  Patient's renal function remains up from inpatient baseline.  Scr 2.55.  UOP 0.9 ml/kg/hr/24 hrs  Follow up random Vancomycin level this AM reported at 25 mcg/ml.   Goal of Therapy:   Vancomycin trough level 15-20 mcg/ml  Monitor renal function, WBC, fever curve, Vancomycin  levels as indicated, any cultures/sensitivities, and clinical progression.  Plan:  1. Will check random Vancomycin level this PM and re-dose Vancomycin pending results. 2. Continue Cefepime as previously ordered.  Stramoski, Elisha Headland,  Pharm.D.   04/21/2014,9:59 AM   Addum:  Vancomycin random level 16.6 mg/L.  Calculated t 1/2 ~30 hours.  Will redose with 2000 mg IV q36 hours.  Will f/u for renal changes and recheck level as needed.  Talbert Cage. PharmD

## 2014-04-21 NOTE — Progress Notes (Signed)
Sent clinicals to [employer], MSW, [place] [phone]

## 2014-04-21 NOTE — Progress Notes (Signed)
Subjective: 3 Days Post-Op Procedure(s) (LRB): EXCISIONAL RIGHT TOTAL HIP ARTHROPLASTY WITH PLACEMENT OF TEMPORARY ANTIBIOTIC SPACERS (Right) CYSTOSCOPY WITH BALLOON DILATION AND RETROGRADE URETHROGRAM (N/A) BALLOON DILATION (N/A) Patient reports pain as moderate.  Hgb up after transfusion, but creatinine still high.  Objective: Vital signs in last 24 hours: Temp:  [97.6 F (36.4 C)-98.2 F (36.8 C)] 97.8 F (36.6 C) (06/05 0530) Pulse Rate:  [51-59] 59 (06/05 0530) Resp:  [16-18] 18 (06/05 0530) BP: (108-146)/(47-75) 130/64 mmHg (06/05 0530) SpO2:  [85 %-93 %] 91 % (06/05 0530)  Intake/Output from previous day: 06/04 0701 - 06/05 0700 In: 1158.3 [P.O.:480; Blood:678.3] Out: 3500 [Urine:3500] Intake/Output this shift:     Recent Labs  04/18/14 1301 04/18/14 1950 04/19/14 0450 04/20/14 0503 04/21/14 0457  HGB 14.2 11.9* 9.6* 8.2* 9.0*    Recent Labs  04/20/14 0503 04/21/14 0457  WBC 10.7* 9.5  RBC 2.87* 3.17*  HCT 25.0* 27.2*  PLT 267 229    Recent Labs  04/20/14 0503 04/21/14 0457  NA 136* 134*  K 3.9 4.3  CL 100 99  CO2 27 26  BUN 41* 42*  CREATININE 2.56* 2.55*  GLUCOSE 137* 107*  CALCIUM 7.8* 8.1*    Recent Labs  04/18/14 1301  INR 1.11    Intact pulses distally Dorsiflexion/Plantar flexion intact Incision: scant drainage  Assessment/Plan: 3 Days Post-Op Procedure(s) (LRB): EXCISIONAL RIGHT TOTAL HIP ARTHROPLASTY WITH PLACEMENT OF TEMPORARY ANTIBIOTIC SPACERS (Right) CYSTOSCOPY WITH BALLOON DILATION AND RETROGRADE URETHROGRAM (N/A) BALLOON DILATION (N/A) Up with therapy Will need to keep in hospital over the weekend to monitor kidney function and hopefully increase mobility. Will have foley removed today Attempt full weight bearing as tolerated right hip.  Jack Barry 04/21/2014, 7:07 AM

## 2014-04-21 NOTE — Progress Notes (Deleted)
Scheduled Meds: . amLODipine  5 mg Oral Daily  . aspirin EC  325 mg Oral BID PC  . buPROPion  150 mg Oral Daily  . carvedilol  6.25 mg Oral BID WC  . ceFEPIme (MAXIPIME) 2 GM IVP  2 g Intravenous 3 times per day  . citalopram  40 mg Oral Daily  . diltiazem  180 mg Oral BID  . docusate sodium  100 mg Oral BID  . ferrous sulfate  325 mg Oral TID PC  . gemfibrozil  600 mg Oral BID AC  . insulin aspart  0-15 Units Subcutaneous TID WC  . insulin aspart  10 Units Subcutaneous TID WC  . insulin detemir  50 Units Subcutaneous Daily  . neomycin-bacitracin-polymyxin   Topical BID  . nitroGLYCERIN  0.2 mg Transdermal Daily  . OxyCODONE  40 mg Oral Q12H  . pantoprazole  40 mg Oral Daily  . sotalol  120 mg Oral BID   Continuous Infusions: . sodium chloride 75 mL/hr (04/19/14 0017)   PRN Meds:.diphenhydrAMINE, HYDROcodone-acetaminophen, LORazepam, metoCLOPramide (REGLAN) injection, metoCLOPramide, morphine injection, ondansetron (ZOFRAN) IV, ondansetron, oxyCODONE, polyethylene glycol, sodium chloride, sodium chloride, tiZANidine

## 2014-04-21 NOTE — Progress Notes (Signed)
Physical Therapy Treatment Patient Details Name: Jack Barry MRN: [id number] DOB: [birth year] Today's Date: 04/21/2014    History of Present Illness 58 y.o. male s/p EXCISIONAL RIGHT TOTAL HIP ARTHROPLASTY WITH PLACEMENT OF TEMPORARY ANTIBIOTIC SPACERS and CYSTOSCOPY WITH BALLOON DILATION AND RETROGRADE URETHROGRAM. Hx of HTN, diabetes, and a-fib.    PT Comments    Pt progressing well towards physical therapy goals, increasing ambulatory distance to 55 feet this afternoon with min guard for safety. Tolerating increased WB status through RLE during ambulation as well. Patient will continue to benefit from skilled physical therapy services to further improve independence with functional mobility.   Follow Up Recommendations  SNF;Supervision for mobility/OOB     Equipment Recommendations  None recommended by PT    Recommendations for Other Services       Precautions / Restrictions Precautions Precautions: None Restrictions Weight Bearing Restrictions: Yes RLE Weight Bearing: Weight bearing as tolerated    Mobility  Bed Mobility                  Transfers Overall transfer level: Needs assistance Equipment used: Rolling walker (2 wheeled) Transfers: Sit to/from Stand Sit to Stand: Min assist         General transfer comment: Min assist to steady RW. Pt with difficulty positioning LEs due to body habitus. Performed from reclining chair. VCs for hand placement  Ambulation/Gait Ambulation/Gait assistance: Min guard;+2 safety/equipment Ambulation Distance (Feet): 55 Feet Assistive device: Rolling walker (2 wheeled) Gait Pattern/deviations: Step-through pattern;Decreased step length - left;Decreased stance time - right;Antalgic Gait velocity: decreased   General Gait Details: Min guard for safety with second person to follow with chair. VCs for UE use as needed. Educated on new WBAT status per MD.   Stairs            Wheelchair Mobility    Modified  Rankin (Stroke Patients Only)       Balance                                    Cognition Arousal/Alertness: Awake/alert Behavior During Therapy: WFL for tasks assessed/performed Overall Cognitive Status: Within Functional Limits for tasks assessed                      Exercises Total Joint Exercises Ankle Circles/Pumps: AROM;Both;10 reps;Seated Quad Sets: AROM;Both;10 reps;Seated Gluteal Sets: Strengthening;Both;10 reps;Seated    General Comments General comments (skin integrity, edema, etc.): Educated on new WBAT status for RLE.      Pertinent Vitals/Pain Pt reports pain as mild this afternoon Patient repositioned in chair for comfort.     Home Living                      Prior Function            PT Goals (current goals can now be found in the care plan section) Acute Rehab PT Goals Patient Stated Goal: Go home PT Goal Formulation: With patient Time For Goal Achievement: 04/26/14 Potential to Achieve Goals: Good Progress towards PT goals: Progressing toward goals    Frequency  7X/week    PT Plan Current plan remains appropriate    Co-evaluation             End of Session   Activity Tolerance: Patient tolerated treatment well Patient left: in chair;with call bell/phone within reach     Time: 1401-1425 PT  Time Calculation (min): 24 min  Charges:  $Gait Training: 8-22 mins $Therapeutic Activity: 8-22 mins                    G Codes:      Sunday SpillersLogan Barry Barry, [name]  [name] 04/21/2014, 3:15 PM

## 2014-04-22 LAB — BASIC METABOLIC PANEL
BUN: 35 mg/dL — AB (ref 6–23)
CO2: 26 mEq/L (ref 19–32)
CREATININE: 2.04 mg/dL — AB (ref 0.50–1.35)
Calcium: 8.2 mg/dL — ABNORMAL LOW (ref 8.4–10.5)
Chloride: 104 mEq/L (ref 96–112)
GFR calc Af Amer: 40 mL/min — ABNORMAL LOW (ref >90)
GFR, EST NON AFRICAN AMERICAN: 35 mL/min — AB (ref >90)
Glucose, Bld: 143 mg/dL — ABNORMAL HIGH (ref 70–99)
Potassium: 4.3 mEq/L (ref 3.7–5.3)
Sodium: 140 mEq/L (ref 137–147)

## 2014-04-22 LAB — GLUCOSE, CAPILLARY
Glucose-Capillary: 152 mg/dL — ABNORMAL HIGH (ref 70–99)
Glucose-Capillary: 114 mg/dL — ABNORMAL HIGH (ref 70–99)
Glucose-Capillary: 163 mg/dL — ABNORMAL HIGH (ref 70–99)
Glucose-Capillary: 120 mg/dL — ABNORMAL HIGH (ref 70–99)

## 2014-04-22 LAB — CBC
HEMATOCRIT: 26.3 % — AB (ref 39.0–52.0)
Hemoglobin: 8.4 g/dL — ABNORMAL LOW (ref 13.0–17.0)
MCH: 27.8 pg (ref 26.0–34.0)
MCHC: 31.9 g/dL (ref 30.0–36.0)
MCV: 87.1 fL (ref 78.0–100.0)
Platelets: 251 10*3/uL (ref 150–400)
RBC: 3.02 MIL/uL — ABNORMAL LOW (ref 4.22–5.81)
RDW: 16 % — AB (ref 11.5–15.5)
WBC: 7.6 10*3/uL (ref 4.0–10.5)

## 2014-04-22 NOTE — Progress Notes (Signed)
Physical Therapy Treatment Patient Details Name: Jack Barry MRN: [id number] DOB: 18-Jul-1956 Today's Date: May 09, 2014    History of Present Illness 58 y.o. male s/p EXCISIONAL RIGHT TOTAL HIP ARTHROPLASTY WITH PLACEMENT OF TEMPORARY ANTIBIOTIC SPACERS and CYSTOSCOPY WITH BALLOON DILATION AND RETROGRADE URETHROGRAM. Hx of HTN, diabetes, and a-fib.    PT Comments     Pt making steady progress with mobility and expressed today that he is leaning more towards going home as he progresses each day. Will need to update goals, add stairs to goals/POC if this is the case at next session. Pt has one single step and then a step up through door after walking across porch.   Follow Up Recommendations  SNF;Supervision for mobility/OOB     Equipment Recommendations  None recommended by PT       Precautions / Restrictions Precautions Precautions: None Precaution Comments: reviewed WB status and anterior hip precautions Restrictions Weight Bearing Restrictions: No RLE Weight Bearing: Weight bearing as tolerated    Mobility  Bed Mobility               General bed mobility comments: not assessed-in recliner before and after session  Transfers Overall transfer level: Needs assistance Equipment used: Rolling walker (2 wheeled) Transfers: Sit to/from Stand Sit to Stand: Supervision         General transfer comment: cues on technique/hand placement.  Ambulation/Gait Ambulation/Gait assistance: Supervision Ambulation Distance (Feet): 70 Feet Assistive device: Rolling walker (2 wheeled) Gait Pattern/deviations: Step-through pattern;Decreased stride length;Antalgic Gait velocity: decreased   General Gait Details: occasional cues on posture and sequence. no chair follow needed today.       Cognition Arousal/Alertness: Awake/alert Behavior During Therapy: WFL for tasks assessed/performed Overall Cognitive Status: Within Functional Limits for tasks assessed                      Exercises Total Joint Exercises Ankle Circles/Pumps: AROM;Both;10 reps;Seated Long Arc Quad: AROM;Strengthening;Left;10 reps;Seated Marching in Standing: Seated;AROM;Strengthening;Left;10 reps                 PT Goals (current goals can now be found in the care plan section) Acute Rehab PT Goals Patient Stated Goal: Go home PT Goal Formulation: With patient Time For Goal Achievement: 04/26/14 Potential to Achieve Goals: Good Progress towards PT goals: Progressing toward goals    Frequency  7X/week    PT Plan Current plan remains appropriate;Other (comment) (pt now considering home with HHPT,if cont's to progress this may be possible)       End of Session Equipment Utilized During Treatment: Gait belt Activity Tolerance: Patient tolerated treatment well Patient left: in chair;with call bell/phone within reach     Time: 1749-4496 PT Time Calculation (min): 26 min  Charges:  $Gait Training: 8-22 mins $Therapeutic Exercise: 8-22 mins                    G Codes:      Sallyanne Kuster 05/09/14, 12:51 PM  Sallyanne Kuster, PTA Office- [phone]

## 2014-04-22 NOTE — Progress Notes (Signed)
Upon arrival to room, pt had already placed CPAP on and is tolerating well. Pt stated he didn't need anything. RT encouraged him to call if he needed any assistance. RT will continue to monitor.

## 2014-04-22 NOTE — Progress Notes (Signed)
Patient ID: Jack Barry, male   DOB: [birth year], 58 y.o.   MRN: [id number] Labs trending in the right direction (creatinine).  Cultures still negative.  Hgb down to 8.4, but mobilizing better.  Plan on likely discharge to home Monday.  Will have foley removed Monday am for voiding trial.

## 2014-04-23 LAB — CBC
HEMATOCRIT: 30.1 % — AB (ref 39.0–52.0)
HEMOGLOBIN: 9.5 g/dL — AB (ref 13.0–17.0)
MCH: 27.9 pg (ref 26.0–34.0)
MCHC: 31.6 g/dL (ref 30.0–36.0)
MCV: 88.3 fL (ref 78.0–100.0)
Platelets: 355 10*3/uL (ref 150–400)
RBC: 3.41 MIL/uL — AB (ref 4.22–5.81)
RDW: 16.4 % — ABNORMAL HIGH (ref 11.5–15.5)
WBC: 8.9 10*3/uL (ref 4.0–10.5)

## 2014-04-23 LAB — BASIC METABOLIC PANEL
BUN: 26 mg/dL — AB (ref 6–23)
CO2: 27 meq/L (ref 19–32)
Calcium: 8.9 mg/dL (ref 8.4–10.5)
Chloride: 105 mEq/L (ref 96–112)
Creatinine, Ser: 1.81 mg/dL — ABNORMAL HIGH (ref 0.50–1.35)
GFR calc Af Amer: 47 mL/min — ABNORMAL LOW (ref >90)
GFR, EST NON AFRICAN AMERICAN: 40 mL/min — AB (ref >90)
GLUCOSE: 149 mg/dL — AB (ref 70–99)
POTASSIUM: 4.9 meq/L (ref 3.7–5.3)
Sodium: 142 mEq/L (ref 137–147)

## 2014-04-23 LAB — GLUCOSE, CAPILLARY
GLUCOSE-CAPILLARY: 128 mg/dL — AB (ref 70–99)
Glucose-Capillary: 145 mg/dL — ABNORMAL HIGH (ref 70–99)
Glucose-Capillary: 98 mg/dL (ref 70–99)
Glucose-Capillary: 105 mg/dL — ABNORMAL HIGH (ref 70–99)

## 2014-04-23 LAB — ANAEROBIC CULTURE: GRAM STAIN: NONE SEEN

## 2014-04-23 NOTE — Progress Notes (Signed)
Physical Therapy Treatment Patient Details Name: Jack Barry MRN: [id number] DOB: [birth year] Today's Date: 2014/05/02    History of Present Illness      PT Comments    D/C plan updated.  Pt plans to d/c home instead of SNF. Plan is for d/c home tomorrow, pending pt able to urinate after foley removal.  Follow Up Recommendations  Home health PT;Supervision for mobility/OOB     Equipment Recommendations  None recommended by PT    Recommendations for Other Services       Precautions / Restrictions Precautions Precautions: None Restrictions RLE Weight Bearing: Weight bearing as tolerated    Mobility  Bed Mobility               General bed mobility comments: Pt up in recliner.  Transfers   Equipment used: Rolling walker (2 wheeled)   Sit to Stand: Supervision Stand pivot transfers: Supervision          Ambulation/Gait Ambulation/Gait assistance: Supervision Ambulation Distance (Feet): 150 Feet Assistive device: Rolling walker (2 wheeled) Gait Pattern/deviations: Decreased stride length;Step-through pattern Gait velocity: decreased       Stairs Stairs: Yes Stairs assistance: Min assist Stair Management: Forwards;With walker Number of Stairs: 1    Wheelchair Mobility    Modified Rankin (Stroke Patients Only)       Balance                                    Cognition Arousal/Alertness: Awake/alert Behavior During Therapy: WFL for tasks assessed/performed Overall Cognitive Status: Within Functional Limits for tasks assessed                      Exercises Total Joint Exercises Ankle Circles/Pumps: AROM;Both;10 reps Hip ABduction/ADduction: AROM;Both;10 reps Long Arc Quad: AROM;Both;10 reps Marching in Standing: AROM;Both;10 reps;Seated    General Comments        Pertinent Vitals/Pain 3/10    Home Living                      Prior Function            PT Goals (current goals can now be  found in the care plan section) Progress towards PT goals: Progressing toward goals    Frequency  7X/week    PT Plan Discharge plan needs to be updated    Co-evaluation             End of Session Equipment Utilized During Treatment: Gait belt Activity Tolerance: Patient tolerated treatment well Patient left: in chair;with call bell/phone within reach     Time: 1105-1131 PT Time Calculation (min): 26 min  Charges:  $Gait Training: 8-22 mins $Therapeutic Exercise: 8-22 mins                    G Codes:      Jack Barry 02-May-2014, 12:34 PM  Aida Raider, PT  Office # [phone] Pager [phone]

## 2014-04-23 NOTE — Progress Notes (Signed)
Subjective: 5 Days Post-Op Procedure(s) (LRB): EXCISIONAL RIGHT TOTAL HIP ARTHROPLASTY WITH PLACEMENT OF TEMPORARY ANTIBIOTIC SPACERS (Right) CYSTOSCOPY WITH BALLOON DILATION AND RETROGRADE URETHROGRAM (N/A) BALLOON DILATION (N/A) Patient reports pain as moderate.    Objective: Vital signs in last 24 hours: Temp:  [98.2 F (36.8 C)-98.7 F (37.1 C)] 98.7 F (37.1 C) (06/07 0540) Pulse Rate:  [59-68] 68 (06/07 0540) Resp:  [18] 18 (06/07 0540) BP: (116-159)/(51-83) 159/76 mmHg (06/07 0540) SpO2:  [95 %-98 %] 98 % (06/07 0540)  Intake/Output from previous day: 06/06 0701 - 06/07 0700 In: 2284.1 [P.O.:1900; I.V.:334.1; IV Piggyback:50] Out: 6675 [Urine:6675] Intake/Output this shift:     Recent Labs  04/21/14 0457 04/22/14 0502 04/23/14 0615  HGB 9.0* 8.4* 9.5*    Recent Labs  04/22/14 0502 04/23/14 0615  WBC 7.6 8.9  RBC 3.02* 3.41*  HCT 26.3* 30.1*  PLT 251 355    Recent Labs  04/22/14 0502 04/23/14 0615  NA 140 142  K 4.3 4.9  CL 104 105  CO2 26 27  BUN 35* 26*  CREATININE 2.04* 1.81*  GLUCOSE 143* 149*  CALCIUM 8.2* 8.9   No results found for this basename: LABPT, INR,  in the last 72 hours  Neurologically intact  Assessment/Plan: 5 Days Post-Op Procedure(s) (LRB): EXCISIONAL RIGHT TOTAL HIP ARTHROPLASTY WITH PLACEMENT OF TEMPORARY ANTIBIOTIC SPACERS (Right) CYSTOSCOPY WITH BALLOON DILATION AND RETROGRADE URETHROGRAM (N/A) BALLOON DILATION (N/A) Up with therapy  Voiding trial Monday and likely home.   Jack Barry 04/23/2014, 7:46 AM

## 2014-04-23 NOTE — Progress Notes (Signed)
Physical Therapy Treatment Patient Details Name: Jack Barry MRN: [id number] DOB: 07-05-1956 Today's Date: 05/17/14    History of Present Illness      PT Comments    D/c plan updated this AM to home with HHPT.  Pt progressing well.  He ambulated 150 ft this AM and 120 ft this PM with RW and supervision.   Follow Up Recommendations  Home health PT;Supervision for mobility/OOB     Equipment Recommendations  None recommended by PT    Recommendations for Other Services       Precautions / Restrictions Precautions Precautions: None Restrictions RLE Weight Bearing: Weight bearing as tolerated    Mobility  Bed Mobility         Supine to sit: Min guard;HOB elevated     General bed mobility comments: use of bedrail  Transfers   Equipment used: Rolling walker (2 wheeled)   Sit to Stand: Supervision Stand pivot transfers: Supervision       General transfer comment: verbal cues for sequencing  Ambulation/Gait Ambulation/Gait assistance: Supervision Ambulation Distance (Feet): 120 Feet Assistive device: Rolling walker (2 wheeled) Gait Pattern/deviations: Decreased stride length;Step-through pattern Gait velocity: decreased       Stairs Stairs: Yes Stairs assistance: Min assist Stair Management: Forwards;With walker Number of Stairs: 1    Wheelchair Mobility    Modified Rankin (Stroke Patients Only)       Balance                                    Cognition Arousal/Alertness: Awake/alert Behavior During Therapy: WFL for tasks assessed/performed Overall Cognitive Status: Within Functional Limits for tasks assessed                      Exercises Total Joint Exercises Ankle Circles/Pumps: AROM;Both;10 reps Hip ABduction/ADduction: AROM;Both;10 reps Long Arc Quad: AROM;Both;10 reps Marching in Standing: AROM;Both;10 reps;Seated    General Comments        Pertinent Vitals/Pain     Home Living                      Prior Function            PT Goals (current goals can now be found in the care plan section) Progress towards PT goals: Progressing toward goals    Frequency  7X/week    PT Plan Current plan remains appropriate    Co-evaluation             End of Session Equipment Utilized During Treatment: Gait belt Activity Tolerance: Patient tolerated treatment well Patient left: in chair;with call bell/phone within reach     Time: 1458-1516 PT Time Calculation (min): 18 min  Charges:  $Gait Training: 8-22 mins $Therapeutic Exercise: 8-22 mins                    G Codes:      Jack Barry 05/17/2014, 3:51 PM  Jack Barry, PT  Office # [phone] Pager [phone]

## 2014-04-23 NOTE — Progress Notes (Signed)
Patient already on CPAP upon entering room. Patient tolerating well at this time.

## 2014-04-24 LAB — BASIC METABOLIC PANEL
BUN: 24 mg/dL — ABNORMAL HIGH (ref 6–23)
CALCIUM: 9.1 mg/dL (ref 8.4–10.5)
CO2: 27 mEq/L (ref 19–32)
Chloride: 104 mEq/L (ref 96–112)
Creatinine, Ser: 1.68 mg/dL — ABNORMAL HIGH (ref 0.50–1.35)
GFR calc Af Amer: 51 mL/min — ABNORMAL LOW (ref >90)
GFR, EST NON AFRICAN AMERICAN: 44 mL/min — AB (ref >90)
Glucose, Bld: 98 mg/dL (ref 70–99)
POTASSIUM: 4.7 meq/L (ref 3.7–5.3)
Sodium: 142 mEq/L (ref 137–147)

## 2014-04-24 LAB — CBC
HCT: 30.8 % — ABNORMAL LOW (ref 39.0–52.0)
Hemoglobin: 9.7 g/dL — ABNORMAL LOW (ref 13.0–17.0)
MCH: 27.9 pg (ref 26.0–34.0)
MCHC: 31.5 g/dL (ref 30.0–36.0)
MCV: 88.5 fL (ref 78.0–100.0)
PLATELETS: 334 10*3/uL (ref 150–400)
RBC: 3.48 MIL/uL — ABNORMAL LOW (ref 4.22–5.81)
RDW: 16.3 % — ABNORMAL HIGH (ref 11.5–15.5)
WBC: 9 10*3/uL (ref 4.0–10.5)

## 2014-04-24 LAB — GLUCOSE, CAPILLARY
Glucose-Capillary: 124 mg/dL — ABNORMAL HIGH (ref 70–99)
Glucose-Capillary: 107 mg/dL — ABNORMAL HIGH (ref 70–99)

## 2014-04-24 MED ORDER — ASPIRIN 325 MG PO TBEC: 325.0000 mg | DELAYED_RELEASE_TABLET | Freq: Two times a day (BID) | ORAL | Status: AC

## 2014-04-24 MED ORDER — HEPARIN SOD (PORK) LOCK FLUSH 100 UNIT/ML IV SOLN
250.0000 [IU] | INTRAVENOUS | Status: AC | PRN
  Administered 2014-04-24: 250 [IU]

## 2014-04-24 MED ORDER — TAMSULOSIN HCL 0.4 MG PO CAPS: 0.4000 mg | ORAL_CAPSULE | Freq: Once | ORAL | Status: DC

## 2014-04-24 MED ORDER — HYDROCODONE-ACETAMINOPHEN 10-325 MG PO TABS: 1.0000 | ORAL_TABLET | Freq: Four times a day (QID) | ORAL | Status: DC | PRN

## 2014-04-24 NOTE — Progress Notes (Signed)
Patient discharged to home accompanied by family. Discharge instructions and rx given and explained and patient stated understanding. PICC line was heparin locked by IV team prior to discharge. Patient left unit in a stable condition with all personal belongings via wheelchair.

## 2014-04-24 NOTE — Progress Notes (Signed)
Patient ID: Jack Barry, male   DOB: 05-30-1956, 58 y.o.   MRN: [id number] Voided fine after foley removal.  will discharge to home today.

## 2014-04-24 NOTE — Discharge Summary (Signed)
Patient ID: Jack Barry MRN: [id number] DOB/AGE: [birth year] 57 y.o.  Admit date: 04/18/2014 Discharge date: 04/24/2014  Admission Diagnoses:  Principal Problem:   Chronic infection of hip joint prosthesis Active Problems:   Loosening of prosthetic hip   Severe obesity (BMI >= 40)   Discharge Diagnoses:  Same  Past Medical History  Diagnosis Date  . Hypertension   . A-fib   . Cellulitis     BLE  . Anxiety   . Hypercholesterolemia   . Pneumonia     "several times"  . Chronic bronchitis     "gets it q yr" (04/19/2014)  . OSA on CPAP   . Type 1 diabetes mellitus   . GERD (gastroesophageal reflux disease)     "little bit" (04/19/2014)  . Depression   . Chronic kidney disease   . Recurrent UTI (urinary tract infection)     Surgeries: Procedure(s): EXCISIONAL RIGHT TOTAL HIP ARTHROPLASTY WITH PLACEMENT OF TEMPORARY ANTIBIOTIC SPACERS CYSTOSCOPY WITH BALLOON DILATION AND RETROGRADE URETHROGRAM BALLOON DILATION on 04/18/2014   Consultants:    Discharged Condition: Improved  Hospital Course: Jack Barry is an 57 y.o. male who was admitted 04/18/2014 for operative treatment ofChronic infection of hip joint prosthesis. Patient has severe unremitting pain that affects sleep, daily activities, and work/hobbies. After pre-op clearance the patient was taken to the operating room on 04/18/2014 and underwent  Procedure(s): EXCISIONAL RIGHT TOTAL HIP ARTHROPLASTY WITH PLACEMENT OF TEMPORARY ANTIBIOTIC SPACERS CYSTOSCOPY WITH BALLOON DILATION AND RETROGRADE URETHROGRAM BALLOON DILATION.    Patient was given perioperative antibiotics: Anti-infectives   Start     Dose/Rate Route Frequency Ordered Stop   04/21/14 2330  vancomycin (VANCOCIN) 2,000 mg in sodium chloride 0.9 % 500 mL IVPB     2,000 mg 250 mL/hr over 120 Minutes Intravenous Every 36 hours 04/21/14 2248     04/19/14 0400  vancomycin (VANCOCIN) 1,500 mg in sodium chloride 0.9 % 500 mL IVPB  Status:  Discontinued      1,500 mg 250 mL/hr over 120 Minutes Intravenous Every 12 hours 04/18/14 2256 04/20/14 1955   04/18/14 2230  ceFEPIme (MAXIPIME) 2 g in dextrose 5 % 50 mL IVPB     2 g 100 mL/hr over 30 Minutes Intravenous 3 times per day 04/18/14 2226     04/18/14 1829  vancomycin (VANCOCIN) powder  Status:  Discontinued       As needed 04/18/14 1830 04/18/14 2125   04/18/14 1828  tobramycin (NEBCIN) powder  Status:  Discontinued       As needed 04/18/14 1829 04/18/14 2125   04/18/14 1500  ceFEPIme (MAXIPIME) 2 g in dextrose 5 % 50 mL IVPB     2 g 100 mL/hr over 30 Minutes Intravenous STAT 04/18/14 1428 04/18/14 1612   04/18/14 1500  vancomycin (VANCOCIN) 1,500 mg in sodium chloride 0.9 % 500 mL IVPB     1,500 mg 250 mL/hr over 120 Minutes Intravenous STAT 04/18/14 1434 04/18/14 1752   04/18/14 1500  tobramycin (NEBCIN) powder 7.2 g  Status:  Discontinued     7.2 g Topical To Surgery 04/18/14 1453 04/18/14 2226   04/18/14 1445  tobramycin (NEBCIN) powder 8.4 g  Status:  Discontinued     8.4 g Topical To Surgery 04/18/14 1444 04/18/14 1452   04/18/14 1445  vancomycin (VANCOCIN) powder 2,000 mg  Status:  Discontinued     2,000 mg Other To Surgery 04/18/14 1445 04/18/14 2226   04/18/14 0600  ceFAZolin (ANCEF) 3 g in dextrose  5 % 50 mL IVPB     3 g 160 mL/hr over 30 Minutes Intravenous On call to O.R. 04/17/14 1408 04/18/14 2024       Patient was given sequential compression devices, early ambulation, and chemoprophylaxis to prevent DVT.  Patient benefited maximally from hospital stay and there were no complications.  His cultures remianed negative throughout his hospitalization.  Recent vital signs: Patient Vitals for the past 24 hrs:  BP Temp Temp src Pulse Resp SpO2  04/24/14 0547 146/69 mmHg 98.4 F (36.9 C) Oral 56 18 96 %  04/23/14 2004 156/76 mmHg 97.3 F (36.3 C) Oral 58 18 92 %  04/23/14 1546 146/67 mmHg 97.9 F (36.6 C) Oral 60 16 95 %     Recent laboratory studies:  Recent Labs   04/23/14 0615 04/24/14 0420  WBC 8.9 9.0  HGB 9.5* 9.7*  HCT 30.1* 30.8*  PLT 355 334  NA 142 142  K 4.9 4.7  CL 105 104  CO2 27 27  BUN 26* 24*  CREATININE 1.81* 1.68*  GLUCOSE 149* 98  CALCIUM 8.9 9.1     Discharge Medications:     Medication List    STOP taking these medications       GLUCOSAMINE HCL PO      TAKE these medications       amLODipine 5 MG tablet  Commonly known as:  NORVASC  Take 5 mg by mouth daily.     aspirin 325 MG EC tablet  Take 1 tablet (325 mg total) by mouth 2 (two) times daily after a meal.     buPROPion 150 MG 24 hr tablet  Commonly known as:  WELLBUTRIN XL  Take 150 mg by mouth daily.     carvedilol 6.25 MG tablet  Commonly known as:  COREG  Take 6.25 mg by mouth 2 (two) times daily with a meal.     ceFEPIme 2 g in dextrose 5 % 50 mL  Inject 2 g into the vein every 8 (eight) hours. For a picc line     citalopram 40 MG tablet  Commonly known as:  CELEXA  Take 40 mg by mouth daily.     diltiazem 180 MG 24 hr capsule  Commonly known as:  DILACOR XR  Take 180 mg by mouth 2 (two) times daily.     Fish Oil 1000 MG Caps  Take 1,000 mg by mouth daily.     gemfibrozil 600 MG tablet  Commonly known as:  LOPID  Take 600 mg by mouth 2 (two) times daily before a meal.     HYDROcodone-acetaminophen 10-325 MG per tablet  Commonly known as:  NORCO  Take 1 tablet by mouth every 6 (six) hours as needed for severe pain.     insulin detemir 100 UNIT/ML injection  Commonly known as:  LEVEMIR  Inject 50 Units into the skin daily.     insulin lispro 100 UNIT/ML injection  Commonly known as:  HUMALOG  Inject 10 Units into the skin 3 (three) times daily before meals.     LORazepam 0.5 MG tablet  Commonly known as:  ATIVAN  Take 0.5 mg by mouth every 8 (eight) hours as needed for anxiety.     nitroGLYCERIN 0.2 mg/hr patch  Commonly known as:  NITRODUR - Dosed in mg/24 hr  Place 0.2 mg onto the skin daily.     pantoprazole 40 MG  tablet  Commonly known as:  PROTONIX  Take 40 mg by mouth  daily.     sotalol 120 MG tablet  Commonly known as:  BETAPACE  Take 120 mg by mouth 2 (two) times daily.     testosterone cypionate 200 MG/ML injection  Commonly known as:  DEPOTESTOTERONE CYPIONATE  Inject 200 mg into the muscle every 7 (seven) days.     tiZANidine 4 MG tablet  Commonly known as:  ZANAFLEX  Take 4 mg by mouth every 8 (eight) hours as needed for muscle spasms.     VANCOMYCIN HCL IV  Inject 1,500 mg into the vein every 12 (twelve) hours. 1500mg  / for a picc line        Diagnostic Studies: Dg Chest 2 View  04/18/2014   CLINICAL DATA:  Preoperative chest x-ray  EXAM: CHEST  2 VIEW  COMPARISON:  Prior chest x-ray 03/21/2010  FINDINGS: Right upper extremity PICC in good position. Catheter tip projects over the distal SVC. Stable cardiomegaly and mediastinal contours. There is pulmonary vascular congestion without overt edema. Nodular opacity in the left lung base remains unchanged dating back to May of 2011 and is therefore almost certainly benign. Surgical changes of prior right shoulder arthroplasty. No pneumothorax or large pleural effusion. No acute osseous abnormality.  IMPRESSION: 1. Cardiomegaly and pulmonary vascular congestion bordering on mild interstitial edema. 2. Right upper extremity PICC with catheter tip in the distal SVC.   Electronically Signed   By: Malachy Moan M.D.   On: 04/18/2014 13:31   Dg Pelvis Portable  04/19/2014   CLINICAL DATA:  Hip replacement.  EXAM: PORTABLE PELVIS 1-2 VIEWS  COMPARISON:  02/25/2010  FINDINGS: Under penetrated study, decreasing diagnostic sensitivity. There is a total right hip arthroplasty with extensive cement around the upper femoral component. There is a lucency around the cement of uncertain chronicity. There is no evidence of periprosthetic fracture or hardware dislocation. Expected subcutaneous gas. Bladder catheter present.  IMPRESSION: Total right hip  arthroplasty as above.   Electronically Signed   By: Tiburcio Pea M.D.   On: 04/19/2014 00:56    Disposition: to home      Discharge Instructions   Call MD / Call 911    Complete by:  As directed   If you experience chest pain or shortness of breath, CALL 911 and be transported to the hospital emergency room.  If you develope a fever above 101 F, pus (white drainage) or increased drainage or redness at the wound, or calf pain, call your surgeon's office.     Constipation Prevention    Complete by:  As directed   Drink plenty of fluids.  Prune juice may be helpful.  You may use a stool softener, such as Colace (over the counter) 100 mg twice a day.  Use MiraLax (over the counter) for constipation as needed.     Diet - low sodium heart healthy    Complete by:  As directed      Discharge instructions    Complete by:  As directed   Expect right hip incision drainage. Keep dry dressing on right hip incision daily. You can get your incision wet in the shower.     Discharge patient    Complete by:  As directed      Increase activity slowly as tolerated    Complete by:  As directed            Follow-up Information   Follow up with [facility]. (Someone from [facility] will contact you concerning start date  and time for home health.)    Contact information:   [address] [place] [address] [phone]       Follow up with Kathryne Hitch, MD In 2 weeks.   Specialty:  Orthopedic Surgery   Contact information:   [address] [place] [address] [phone]        Signed: Kathryne Hitch 04/24/2014, 12:24 PM

## 2014-04-24 NOTE — Progress Notes (Signed)
Occupational Therapy Treatment Patient Details Name: Michaell Cowinghomas L Bastidas MRN: [id number] DOB: October 15, 1956 Today's Date: 04/24/2014    History of present illness 58 y.o. male s/p EXCISIONAL RIGHT TOTAL HIP ARTHROPLASTY WITH PLACEMENT OF TEMPORARY ANTIBIOTIC SPACERS and CYSTOSCOPY WITH BALLOON DILATION AND RETROGRADE URETHROGRAM. Hx of HTN, diabetes, and a-fib.   OT comments  Pt progressing. Education provided to pt and family member during session.  Follow Up Recommendations  Home health OT;Supervision - Intermittent    Equipment Recommendations   (AE)    Recommendations for Other Services      Precautions / Restrictions Precautions Precautions: Anterior Hip Precaution Booklet Issued: Yes (comment) Precaution Comments: reviewed WB status and anterior hip precautions Restrictions Weight Bearing Restrictions: Yes RLE Weight Bearing: Weight bearing as tolerated       Mobility Bed Mobility Overal bed mobility: Needs Assistance   General bed mobility comments: not assessed but OT educated on technique with pt and his spouse.  Transfers Overall transfer level: Needs assistance Equipment used: Rolling walker (2 wheeled) Transfers: Sit to/from Stand Sit to Stand: Min guard;Supervision            Balance                                   ADL Overall ADL's : Needs assistance/impaired     Grooming: Wash/dry hands;Standing;Supervision/safety;Sitting;Set up (applied lotion sitting-setup/supervision)               Lower Body Dressing: Minimal assistance;Sit to/from stand;With adaptive equipment (donned underwear and donned/doffed sock)   Toilet Transfer: Min guard;Ambulation;Comfort height toilet;RW   Toileting- Clothing Manipulation and Hygiene: Supervision/safety (standing)       Functional mobility during ADLs: Min guard;Rolling walker General ADL Comments: Educated on safe shoewear and use of elastic shoelaces. pt practiced with AE and OT educated  on dressing technique. Recommended sitting for most of LB bathing/dressing. Educated on use of bag on walker.  Educated on toilet aid for hygiene. Pt applied lotion to legs using washcloth on reacher-OT helped problem solve ideas for this and placed washcloth on reacher.      Vision                     Perception     Praxis      Cognition   Behavior [facility] for tasks assessed/performed Overall Cognitive Status: Within Functional Limits for tasks assessed                       Extremity/Trunk Assessment               Exercises     Shoulder Instructions       General Comments      Pertinent Vitals/ Pain       Pain 5/10. Notified nurse.  Home Living                                          Prior Functioning/Environment              Frequency Min 3X/week     Progress Toward Goals  OT Goals(current goals can now be found in the care plan section)  Progress towards OT goals: Progressing toward goals  Acute Rehab OT Goals Patient Stated Goal: not stated OT Goal Formulation:  With patient Time For Goal Achievement: 04/26/14 Potential to Achieve Goals: Good ADL Goals Pt Will Perform Lower Body Bathing: with min assist;with adaptive equipment;sit to/from stand Pt Will Perform Lower Body Dressing: with min assist;with adaptive equipment;sit to/from stand Pt Will Transfer to Toilet: with min guard assist;ambulating (bariatric 3 in 1 over toilet) Pt Will Perform Toileting - Clothing Manipulation and hygiene: with supervision;with adaptive equipment;sit to/from stand Pt Will Perform Tub/Shower Transfer: with supervision;ambulating;3 in 1;rolling walker Additional ADL Goal #1: Pt will perform bed mobility with HOB flat at min guard level whilw observing precautions to prepare for OOB ADLs.  Plan Discharge plan needs to be updated    Co-evaluation                 End of Session Equipment Utilized During  Treatment: Gait belt;Rolling walker   Activity Tolerance Patient tolerated treatment well   Patient Left in chair;with call bell/phone within reach;with family/visitor present   Nurse Communication  pain level        Time: 7619-5093 OT Time Calculation (min): 34 min  Charges: OT General Charges $OT Visit: 1 Procedure OT Treatments $Self Care/Home Management : 8-22 mins $Therapeutic Activity: 8-22 mins  Earlie Raveling OTR/L [phone] 04/24/2014, 12:54 PM

## 2014-04-24 NOTE — Progress Notes (Signed)
Patient ID: Jack Barry, male   DOB: [birth year], 57 y.o.   MRN: [id number] Looks good overall.  Labs trending in the right direction.  Will have foley cath removed this am.  If voids, can go home this afternoon.

## 2014-04-24 NOTE — Care Management Note (Signed)
CARE MANAGEMENT NOTE 04/24/2014  Patient:  Jack Barry, Jack Barry   Account Number:  [id number]  Date Initiated:  04/24/2014  Documentation initiated by:  Vance Peper  Subjective/Objective Assessment:   58 yr old male s/p duraplasty of  right hip femoral component, with placement of temporary compartment with vanc and tobramycin.     Action/Plan:   Case manager spoke with patient concerning need forhome health RN and PT. Patient states he and wife decided to stay with [facility]. Has PICC for IV antibiotics. Has DME. CM contacted Jodene Nam, [facility] liason.   Anticipated DC Date:  04/24/2014   Anticipated DC Plan:  HOME W HOME HEALTH SERVICES      DC Planning Services  CM consult      [facility] Choice  HOME HEALTH   Choice offered to / List presented to:  C-1 Patient        HH arranged  IV Antibiotics  HH-2 PT  HH-1 RN      [facility] agency  [facility]   Status of service:   Medicare Important Message given?   (If response is "NO", the following Medicare IM given date fields will be blank) Date Medicare IM given:   Date Additional Medicare IM given:    Discharge Disposition:  HOME W HOME HEALTH SERVICES  Per UR Regulation:

## 2014-04-24 NOTE — Progress Notes (Signed)
Physical Therapy Treatment Patient Details Name: Jack Barry MRN: [id number] DOB: [birth year] Today's Date: 04/24/2014    History of Present Illness 58 y.o. male s/p EXCISIONAL RIGHT TOTAL HIP ARTHROPLASTY WITH PLACEMENT OF TEMPORARY ANTIBIOTIC SPACERS and CYSTOSCOPY WITH BALLOON DILATION AND RETROGRADE URETHROGRAM. Hx of HTN, diabetes, and a-fib.    PT Comments    Patient is progressing well towards physical therapy goals, ambulating up to 100 feet today at supervision level using rolling walker. He declines further stair training and states he feels comfortable with this task from previous therapy sessions. Focused on bed mobility training this AM, as this was pts greatest concern for d/c to home; he now safely demonstrates ability to get in/out of bed. Pt is adequate for d/c to home from PT standpoint.   Follow Up Recommendations  Home health PT;Supervision for mobility/OOB     Equipment Recommendations  None recommended by PT    Recommendations for Other Services       Precautions / Restrictions Precautions Precautions: Anterior Hip Precaution Booklet Issued: Yes (comment) Precaution Comments: reviewed WB status and anterior hip precautions Restrictions Weight Bearing Restrictions: Yes RLE Weight Bearing: Weight bearing as tolerated    Mobility  Bed Mobility Overal bed mobility: Needs Assistance Bed Mobility: Supine to Sit;Sit to Supine     Supine to sit: Supervision Sit to supine: Min assist   General bed mobility comments: Pt was min assist for sit>supine for RLE support and cues for technique with use of LLE for support into bed. Supervision with supine>sit, requiring VCs for technique. Performs this task safely with HOB flat.  Transfers Overall transfer level: Modified independent Equipment used: Rolling walker (2 wheeled) Transfers: Sit to/from Stand Sit to Stand: Modified independent (Device/Increase time)         General transfer comment: Mod I no  assist needed. Educated on safe chair placement at home to prevent slipping.  Ambulation/Gait Ambulation/Gait assistance: Supervision Ambulation Distance (Feet): 100 Feet Assistive device: Rolling walker (2 wheeled) Gait Pattern/deviations: Step-through pattern;Decreased step length - left;Decreased stance time - right   Gait velocity interpretation: Below normal speed for age/gender General Gait Details: VCs for upright posture. no chair follow needed today. Safely navigated throughout bathroom demonstrating good standing balance as well.    Stairs            Wheelchair Mobility    Modified Rankin (Stroke Patients Only)       Balance                                    Cognition Arousal/Alertness: Awake/alert Behavior During Therapy: WFL for tasks assessed/performed Overall Cognitive Status: Within Functional Limits for tasks assessed                      Exercises      General Comments General comments (skin integrity, edema, etc.): Pt correctly verbalizes anterior hip precautions      Pertinent Vitals/Pain Pt with no complaints of pain Patient repositioned in chair for comfort.     Home Living                      Prior Function            PT Goals (current goals can now be found in the care plan section) Acute Rehab PT Goals Patient Stated Goal: Go home PT Goal Formulation: With patient Time  For Goal Achievement: 04/26/14 Potential to Achieve Goals: Good Progress towards PT goals: Progressing toward goals    Frequency  7X/week    PT Plan Current plan remains appropriate    Co-evaluation             End of Session Equipment Utilized During Treatment: Gait belt Activity Tolerance: Patient tolerated treatment well Patient left: in chair;with call bell/phone within reach;with family/visitor present     Time: 6237-6283 PT Time Calculation (min): 27 min  Charges:  $Gait Training: 8-22 mins $Therapeutic  Activity: 8-22 mins                    G CodesSunday Spillers [place], [place] [phone]  Berton Mount 04/24/2014, 12:22 PM

## 2014-05-31 LAB — AFB CULTURE WITH SMEAR (NOT AT ARMC): Acid Fast Smear: NONE SEEN

## 2014-06-02 NOTE — OR Nursing (Signed)
Late entry completing type, subtype and infection for procedural classification

## 2014-06-28 ENCOUNTER — Other Ambulatory Visit (HOSPITAL_COMMUNITY): Payer: Self-pay | Admitting: Orthopaedic Surgery

## 2014-06-29 ENCOUNTER — Other Ambulatory Visit: Payer: Self-pay | Admitting: Urology

## 2014-07-20 ENCOUNTER — Encounter (HOSPITAL_COMMUNITY): Payer: Self-pay | Admitting: *Deleted

## 2014-07-20 ENCOUNTER — Encounter (HOSPITAL_COMMUNITY): Payer: Self-pay | Admitting: Pharmacy Technician

## 2014-07-20 MED ORDER — DEXTROSE 5 % IV SOLN
3.0000 g | INTRAVENOUS | Status: AC
  Administered 2014-07-21: 3 g via INTRAVENOUS
  Filled 2014-07-20: qty 3000

## 2014-07-20 NOTE — H&P (Signed)
History of Present Illness   Jack Barry was here to follow up for his stricture. He had a lot of cystitis and I wanted to make certain his irritative symptoms settled down. He said at first when the catheter came out he had a lot of frequency, but now he can hold it for a few hours and he says his flow is great.  He did not void but was bladder scanned for 154 mL.   He is having surgery next Tuesday. His is on my list to put the catheter in around 11 a.m. at [facility].  Review of systems: No change in bowel or neurologic status.   His stricture and flow symptoms and frequency are all improved.     Past Medical History Problems  1. History of  Anxiety (Symptom) 300.00 2. History of  Diabetes Mellitus 250.00 3. History of  Hypercholesterolemia 272.0 4. History of  Hypertension 401.9  Surgical History Problems  1. History of  Cystoscopy For Urethral Stricture 2. History of  Cystoscopy For Urethral Stricture 3. History of  Neck Surgery 4. History of  Shoulder Surgery 5. History of  Tonsillectomy  Current Meds 1. HTN Medication; Status: ACTIVE  Allergies Medication  1. No Known Drug Allergies  Family History Problems  1. Maternal history of  Breast Cancer V16.3 Category: Maternal history of; Status: Active 2. Paternal history of  Death In The Family Father father passed @ age 66heart attack; Category: Paternal history of; Status: Active 3. Paternal grandmother's history of  Diabetes Mellitus V18.0 Category: Paternal grandmother's history of; Status: Active 4. Family history of  Family Health Status Number Of Children 1 son1 daughter; Category: Family history of; Status: Active 5. Paternal history of  Heart Disease V17.49 Category: Paternal history of; Status: Active  Social History Problems  1. Caffeine Use 2 drinks daily; Status: Active 2. Marital History - Currently Married Status: Active 3. Occupation: Psychologist, forensic; Status: Active 4. History of   Tobacco Use V15.82 smoked 2 packs dailyquit smoking 10 years agosmoked for 20 years; Category: History  of; Status: Resolved Denied  5. History of  Alcohol Use Category: History of; Status: Denied  Assessment Assessed  1. 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 Urinary Tract Infection 599.0  Plan   Follow up next week to place catheter 11 a.m. This will be placed on my schedule.  After a thorough review of the management options for the patient's condition the patient  elected to proceed with surgical therapy as noted above. We have discussed the potential benefits and risks of the procedure, side effects of the proposed treatment, the likelihood of the patient achieving the goals of the procedure, and any potential problems that might occur during the procedure or recuperation. Informed consent has been obtained.

## 2014-07-20 NOTE — Progress Notes (Signed)
Pre op instructions to patient  given by telephone feedback during discuss

## 2014-07-21 ENCOUNTER — Ambulatory Visit (HOSPITAL_COMMUNITY): Payer: 59

## 2014-07-21 ENCOUNTER — Encounter (HOSPITAL_COMMUNITY)
Admission: RE | Disposition: A | Discharge status: Home Health | Payer: Self-pay | Source: Ambulatory Visit | Attending: Orthopaedic Surgery

## 2014-07-21 ENCOUNTER — Encounter (HOSPITAL_COMMUNITY): Payer: 59 | Admitting: Certified Registered Nurse Anesthetist

## 2014-07-21 ENCOUNTER — Inpatient Hospital Stay (HOSPITAL_COMMUNITY): Payer: 59

## 2014-07-21 ENCOUNTER — Ambulatory Visit (HOSPITAL_COMMUNITY): Payer: 59 | Admitting: Certified Registered Nurse Anesthetist

## 2014-07-21 ENCOUNTER — Inpatient Hospital Stay (HOSPITAL_COMMUNITY)
Admission: RE | Admit: 2014-07-21 | Discharge: 2014-07-25 | DRG: 467 | Disposition: A | Discharge status: Home Health | Payer: 59 | Source: Ambulatory Visit | Attending: Orthopaedic Surgery | Admitting: Orthopaedic Surgery

## 2014-07-21 ENCOUNTER — Encounter (HOSPITAL_COMMUNITY): Payer: Self-pay | Admitting: *Deleted

## 2014-07-21 DIAGNOSIS — Z96649 Presence of unspecified artificial hip joint: Secondary | ICD-10-CM | POA: Diagnosis not present

## 2014-07-21 DIAGNOSIS — J42 Unspecified chronic bronchitis: Secondary | ICD-10-CM | POA: Diagnosis present

## 2014-07-21 DIAGNOSIS — Q5564 Hidden penis: Secondary | ICD-10-CM

## 2014-07-21 DIAGNOSIS — Z8701 Personal history of pneumonia (recurrent): Secondary | ICD-10-CM | POA: Diagnosis not present

## 2014-07-21 DIAGNOSIS — K219 Gastro-esophageal reflux disease without esophagitis: Secondary | ICD-10-CM | POA: Diagnosis present

## 2014-07-21 DIAGNOSIS — D62 Acute posthemorrhagic anemia: Secondary | ICD-10-CM | POA: Diagnosis not present

## 2014-07-21 DIAGNOSIS — F329 Major depressive disorder, single episode, unspecified: Secondary | ICD-10-CM | POA: Diagnosis present

## 2014-07-21 DIAGNOSIS — Z9109 Other allergy status, other than to drugs and biological substances: Secondary | ICD-10-CM

## 2014-07-21 DIAGNOSIS — Z7982 Long term (current) use of aspirin: Secondary | ICD-10-CM | POA: Diagnosis not present

## 2014-07-21 DIAGNOSIS — T84038A Mechanical loosening of other internal prosthetic joint, initial encounter: Secondary | ICD-10-CM

## 2014-07-21 DIAGNOSIS — Z8249 Family history of ischemic heart disease and other diseases of the circulatory system: Secondary | ICD-10-CM | POA: Diagnosis not present

## 2014-07-21 DIAGNOSIS — Z794 Long term (current) use of insulin: Secondary | ICD-10-CM | POA: Diagnosis not present

## 2014-07-21 DIAGNOSIS — M25559 Pain in unspecified hip: Secondary | ICD-10-CM | POA: Diagnosis present

## 2014-07-21 DIAGNOSIS — I129 Hypertensive chronic kidney disease with stage 1 through stage 4 chronic kidney disease, or unspecified chronic kidney disease: Secondary | ICD-10-CM | POA: Diagnosis present

## 2014-07-21 DIAGNOSIS — G4733 Obstructive sleep apnea (adult) (pediatric): Secondary | ICD-10-CM | POA: Diagnosis present

## 2014-07-21 DIAGNOSIS — F3289 Other specified depressive episodes: Secondary | ICD-10-CM | POA: Diagnosis present

## 2014-07-21 DIAGNOSIS — T84039A Mechanical loosening of unspecified internal prosthetic joint, initial encounter: Secondary | ICD-10-CM | POA: Diagnosis present

## 2014-07-21 DIAGNOSIS — Z833 Family history of diabetes mellitus: Secondary | ICD-10-CM

## 2014-07-21 DIAGNOSIS — Z881 Allergy status to other antibiotic agents status: Secondary | ICD-10-CM | POA: Diagnosis not present

## 2014-07-21 DIAGNOSIS — Z79899 Other long term (current) drug therapy: Secondary | ICD-10-CM | POA: Diagnosis not present

## 2014-07-21 DIAGNOSIS — N35919 Unspecified urethral stricture, male, unspecified site: Secondary | ICD-10-CM | POA: Diagnosis present

## 2014-07-21 DIAGNOSIS — E109 Type 1 diabetes mellitus without complications: Secondary | ICD-10-CM | POA: Diagnosis present

## 2014-07-21 DIAGNOSIS — N189 Chronic kidney disease, unspecified: Secondary | ICD-10-CM | POA: Diagnosis present

## 2014-07-21 DIAGNOSIS — Y831 Surgical operation with implant of artificial internal device as the cause of abnormal reaction of the patient, or of later complication, without mention of misadventure at the time of the procedure: Secondary | ICD-10-CM | POA: Diagnosis present

## 2014-07-21 DIAGNOSIS — Z87891 Personal history of nicotine dependence: Secondary | ICD-10-CM

## 2014-07-21 DIAGNOSIS — I4891 Unspecified atrial fibrillation: Secondary | ICD-10-CM | POA: Diagnosis present

## 2014-07-21 DIAGNOSIS — Z6841 Body Mass Index (BMI) 40.0 and over, adult: Secondary | ICD-10-CM | POA: Diagnosis not present

## 2014-07-21 DIAGNOSIS — Z803 Family history of malignant neoplasm of breast: Secondary | ICD-10-CM | POA: Diagnosis not present

## 2014-07-21 DIAGNOSIS — N320 Bladder-neck obstruction: Secondary | ICD-10-CM | POA: Diagnosis present

## 2014-07-21 DIAGNOSIS — Z87440 Personal history of urinary (tract) infections: Secondary | ICD-10-CM | POA: Diagnosis not present

## 2014-07-21 DIAGNOSIS — E7800 Pure hypercholesterolemia, unspecified: Secondary | ICD-10-CM | POA: Diagnosis present

## 2014-07-21 DIAGNOSIS — F411 Generalized anxiety disorder: Secondary | ICD-10-CM | POA: Diagnosis present

## 2014-07-21 HISTORY — PX: TOTAL HIP REVISION: SHX763

## 2014-07-21 HISTORY — PX: CYSTOSCOPY WITH RETROGRADE URETHROGRAM: SHX6309

## 2014-07-21 LAB — BASIC METABOLIC PANEL
Anion gap: 13 (ref 5–15)
BUN: 30 mg/dL — AB (ref 6–23)
CO2: 28 mEq/L (ref 19–32)
CREATININE: 1.53 mg/dL — AB (ref 0.50–1.35)
Calcium: 9.7 mg/dL (ref 8.4–10.5)
Chloride: 100 mEq/L (ref 96–112)
GFR calc non Af Amer: 49 mL/min — ABNORMAL LOW (ref >90)
GFR, EST AFRICAN AMERICAN: 57 mL/min — AB (ref >90)
Glucose, Bld: 142 mg/dL — ABNORMAL HIGH (ref 70–99)
Potassium: 4.5 mEq/L (ref 3.7–5.3)
Sodium: 141 mEq/L (ref 137–147)

## 2014-07-21 LAB — URINALYSIS, ROUTINE W REFLEX MICROSCOPIC
Bilirubin Urine: NEGATIVE
Glucose, UA: NEGATIVE mg/dL
KETONES UR: NEGATIVE mg/dL
LEUKOCYTES UA: NEGATIVE
NITRITE: NEGATIVE
Specific Gravity, Urine: 1.018 (ref 1.005–1.030)
Urobilinogen, UA: 0.2 mg/dL (ref 0.0–1.0)
pH: 6 (ref 5.0–8.0)

## 2014-07-21 LAB — CBC
HCT: 39.8 % (ref 39.0–52.0)
Hemoglobin: 12.8 g/dL — ABNORMAL LOW (ref 13.0–17.0)
MCH: 26.9 pg (ref 26.0–34.0)
MCHC: 32.2 g/dL (ref 30.0–36.0)
MCV: 83.6 fL (ref 78.0–100.0)
Platelets: 402 10*3/uL — ABNORMAL HIGH (ref 150–400)
RBC: 4.76 MIL/uL (ref 4.22–5.81)
RDW: 14 % (ref 11.5–15.5)
WBC: 10.2 10*3/uL (ref 4.0–10.5)

## 2014-07-21 LAB — PROTIME-INR
INR: 1.08 (ref 0.00–1.49)
Prothrombin Time: 14 seconds (ref 11.6–15.2)

## 2014-07-21 LAB — URINE MICROSCOPIC-ADD ON

## 2014-07-21 LAB — GLUCOSE, CAPILLARY
GLUCOSE-CAPILLARY: 261 mg/dL — AB (ref 70–99)
GLUCOSE-CAPILLARY: 160 mg/dL — AB (ref 70–99)
Glucose-Capillary: 142 mg/dL — ABNORMAL HIGH (ref 70–99)
Glucose-Capillary: 195 mg/dL — ABNORMAL HIGH (ref 70–99)

## 2014-07-21 LAB — SURGICAL PCR SCREEN
MRSA, PCR: NEGATIVE
Staphylococcus aureus: NEGATIVE

## 2014-07-21 LAB — APTT: aPTT: 34 seconds (ref 24–37)

## 2014-07-21 SURGERY — TOTAL HIP REVISION
Anesthesia: General | Site: Urethra | Laterality: Right

## 2014-07-21 MED ORDER — ZOLPIDEM TARTRATE 5 MG PO TABS: 5.0000 mg | ORAL_TABLET | Freq: Every evening | ORAL | Status: DC | PRN

## 2014-07-21 MED ORDER — PROPOFOL 10 MG/ML IV BOLUS
INTRAVENOUS | Status: AC
  Filled 2014-07-21: qty 20

## 2014-07-21 MED ORDER — LACTATED RINGERS IV SOLN: INTRAVENOUS | Status: DC

## 2014-07-21 MED ORDER — DEXAMETHASONE SODIUM PHOSPHATE 10 MG/ML IJ SOLN
INTRAMUSCULAR | Status: DC | PRN
  Administered 2014-07-21: 10 mg via INTRAVENOUS

## 2014-07-21 MED ORDER — GLYCOPYRROLATE 0.2 MG/ML IJ SOLN
INTRAMUSCULAR | Status: DC | PRN
  Administered 2014-07-21: 0.6 mg via INTRAVENOUS

## 2014-07-21 MED ORDER — ROCURONIUM BROMIDE 100 MG/10ML IV SOLN
INTRAVENOUS | Status: DC | PRN
  Administered 2014-07-21: 20 mg via INTRAVENOUS
  Administered 2014-07-21 (×2): 10 mg via INTRAVENOUS
  Administered 2014-07-21: 20 mg via INTRAVENOUS
  Administered 2014-07-21: 50 mg via INTRAVENOUS

## 2014-07-21 MED ORDER — MIDAZOLAM HCL 5 MG/5ML IJ SOLN
INTRAMUSCULAR | Status: DC | PRN
  Administered 2014-07-21: 2 mg via INTRAVENOUS

## 2014-07-21 MED ORDER — OXYCODONE HCL ER 20 MG PO T12A
40.0000 mg | EXTENDED_RELEASE_TABLET | Freq: Two times a day (BID) | ORAL | Status: DC
  Administered 2014-07-21 – 2014-07-25 (×8): 40 mg via ORAL
  Filled 2014-07-21 (×8): qty 2

## 2014-07-21 MED ORDER — KETAMINE HCL 10 MG/ML IJ SOLN
INTRAMUSCULAR | Status: AC
  Filled 2014-07-21: qty 1

## 2014-07-21 MED ORDER — HYDROMORPHONE HCL PF 1 MG/ML IJ SOLN
INTRAMUSCULAR | Status: DC | PRN
  Administered 2014-07-21: 1 mg via INTRAVENOUS
  Administered 2014-07-21 (×2): 0.5 mg via INTRAVENOUS

## 2014-07-21 MED ORDER — ASPIRIN EC 325 MG PO TBEC
325.0000 mg | DELAYED_RELEASE_TABLET | Freq: Two times a day (BID) | ORAL | Status: DC
  Administered 2014-07-22 – 2014-07-25 (×7): 325 mg via ORAL
  Filled 2014-07-21 (×9): qty 1

## 2014-07-21 MED ORDER — POLYETHYLENE GLYCOL 3350 17 G PO PACK
17.0000 g | PACK | Freq: Every day | ORAL | Status: DC | PRN
  Administered 2014-07-22 – 2014-07-23 (×2): 17 g via ORAL

## 2014-07-21 MED ORDER — FENTANYL CITRATE 0.05 MG/ML IJ SOLN
INTRAMUSCULAR | Status: DC | PRN
  Administered 2014-07-21: 100 ug via INTRAVENOUS
  Administered 2014-07-21 (×2): 50 ug via INTRAVENOUS
  Administered 2014-07-21: 100 ug via INTRAVENOUS
  Administered 2014-07-21 (×4): 50 ug via INTRAVENOUS

## 2014-07-21 MED ORDER — SODIUM CHLORIDE 0.9 % IR SOLN
Status: DC | PRN
  Administered 2014-07-21: 3000 mL

## 2014-07-21 MED ORDER — HYDROMORPHONE HCL PF 1 MG/ML IJ SOLN
INTRAMUSCULAR | Status: AC
  Administered 2014-07-22: 1 mg via INTRAVENOUS
  Filled 2014-07-21: qty 1

## 2014-07-21 MED ORDER — HYDROMORPHONE HCL PF 1 MG/ML IJ SOLN
1.0000 mg | INTRAMUSCULAR | Status: DC | PRN
  Administered 2014-07-21 – 2014-07-24 (×12): 1 mg via INTRAVENOUS
  Filled 2014-07-21 (×12): qty 1

## 2014-07-21 MED ORDER — METOCLOPRAMIDE HCL 10 MG PO TABS: 5.0000 mg | ORAL_TABLET | Freq: Three times a day (TID) | ORAL | Status: DC | PRN

## 2014-07-21 MED ORDER — 0.9 % SODIUM CHLORIDE (POUR BTL) OPTIME
TOPICAL | Status: DC | PRN
  Administered 2014-07-21: 1000 mL

## 2014-07-21 MED ORDER — METHOCARBAMOL 1000 MG/10ML IJ SOLN
500.0000 mg | Freq: Four times a day (QID) | INTRAVENOUS | Status: DC | PRN
  Administered 2014-07-21: 500 mg via INTRAVENOUS
  Filled 2014-07-21: qty 5

## 2014-07-21 MED ORDER — ONDANSETRON HCL 4 MG PO TABS: 4.0000 mg | ORAL_TABLET | Freq: Four times a day (QID) | ORAL | Status: DC | PRN

## 2014-07-21 MED ORDER — GEMFIBROZIL 600 MG PO TABS
600.0000 mg | ORAL_TABLET | Freq: Two times a day (BID) | ORAL | Status: DC
  Administered 2014-07-21 – 2014-07-25 (×8): 600 mg via ORAL
  Filled 2014-07-21 (×10): qty 1

## 2014-07-21 MED ORDER — LIDOCAINE HCL (CARDIAC) 20 MG/ML IV SOLN
INTRAVENOUS | Status: AC
  Filled 2014-07-21: qty 5

## 2014-07-21 MED ORDER — EPHEDRINE SULFATE 50 MG/ML IJ SOLN
INTRAMUSCULAR | Status: AC
  Filled 2014-07-21: qty 1

## 2014-07-21 MED ORDER — CARVEDILOL 6.25 MG PO TABS
6.2500 mg | ORAL_TABLET | Freq: Two times a day (BID) | ORAL | Status: DC
  Administered 2014-07-21 – 2014-07-25 (×8): 6.25 mg via ORAL
  Filled 2014-07-21 (×10): qty 1

## 2014-07-21 MED ORDER — ONDANSETRON HCL 4 MG/2ML IJ SOLN: 4.0000 mg | Freq: Four times a day (QID) | INTRAMUSCULAR | Status: DC | PRN

## 2014-07-21 MED ORDER — PANTOPRAZOLE SODIUM 40 MG PO TBEC
40.0000 mg | DELAYED_RELEASE_TABLET | Freq: Every morning | ORAL | Status: DC
  Administered 2014-07-21 – 2014-07-25 (×5): 40 mg via ORAL
  Filled 2014-07-21 (×5): qty 1

## 2014-07-21 MED ORDER — EPHEDRINE SULFATE 50 MG/ML IJ SOLN
INTRAMUSCULAR | Status: DC | PRN
  Administered 2014-07-21 (×2): 10 mg via INTRAVENOUS

## 2014-07-21 MED ORDER — MUPIROCIN 2 % EX OINT
1.0000 "application " | TOPICAL_OINTMENT | Freq: Once | CUTANEOUS | Status: DC
  Filled 2014-07-21: qty 22

## 2014-07-21 MED ORDER — SOTALOL HCL 120 MG PO TABS
120.0000 mg | ORAL_TABLET | Freq: Two times a day (BID) | ORAL | Status: DC
  Administered 2014-07-21 – 2014-07-25 (×8): 120 mg via ORAL
  Filled 2014-07-21 (×9): qty 1

## 2014-07-21 MED ORDER — NEOSTIGMINE METHYLSULFATE 10 MG/10ML IV SOLN
INTRAVENOUS | Status: AC
Start: 2014-07-21 — End: 2014-07-21
  Filled 2014-07-21: qty 1

## 2014-07-21 MED ORDER — MIDAZOLAM HCL 2 MG/2ML IJ SOLN
INTRAMUSCULAR | Status: AC
  Filled 2014-07-21: qty 2

## 2014-07-21 MED ORDER — HYDROCODONE-ACETAMINOPHEN 10-325 MG PO TABS
1.0000 | ORAL_TABLET | Freq: Four times a day (QID) | ORAL | Status: DC | PRN
  Administered 2014-07-22 – 2014-07-23 (×3): 1 via ORAL
  Filled 2014-07-21 (×4): qty 1

## 2014-07-21 MED ORDER — SODIUM CHLORIDE 0.9 % IJ SOLN
INTRAMUSCULAR | Status: AC
  Filled 2014-07-21: qty 10

## 2014-07-21 MED ORDER — METOCLOPRAMIDE HCL 5 MG/ML IJ SOLN: 5.0000 mg | Freq: Three times a day (TID) | INTRAMUSCULAR | Status: DC | PRN

## 2014-07-21 MED ORDER — HYDROMORPHONE HCL PF 2 MG/ML IJ SOLN
INTRAMUSCULAR | Status: AC
  Filled 2014-07-21: qty 1

## 2014-07-21 MED ORDER — OXYCODONE HCL 5 MG PO TABS
5.0000 mg | ORAL_TABLET | ORAL | Status: DC | PRN
Start: 2014-07-21 — End: 2014-07-25
  Administered 2014-07-21 – 2014-07-25 (×15): 15 mg via ORAL
  Filled 2014-07-21 (×15): qty 3

## 2014-07-21 MED ORDER — BELLADONNA ALKALOIDS-OPIUM 16.2-60 MG RE SUPP
RECTAL | Status: AC
  Filled 2014-07-21: qty 1

## 2014-07-21 MED ORDER — CITALOPRAM HYDROBROMIDE 40 MG PO TABS
40.0000 mg | ORAL_TABLET | Freq: Every morning | ORAL | Status: DC
  Administered 2014-07-21 – 2014-07-25 (×5): 40 mg via ORAL
  Filled 2014-07-21 (×5): qty 1

## 2014-07-21 MED ORDER — DILTIAZEM HCL ER 180 MG PO CP24
180.0000 mg | ORAL_CAPSULE | Freq: Two times a day (BID) | ORAL | Status: DC
  Administered 2014-07-21 – 2014-07-25 (×8): 180 mg via ORAL
  Filled 2014-07-21 (×9): qty 1

## 2014-07-21 MED ORDER — DOCUSATE SODIUM 100 MG PO CAPS
100.0000 mg | ORAL_CAPSULE | Freq: Two times a day (BID) | ORAL | Status: DC
  Administered 2014-07-21 – 2014-07-25 (×8): 100 mg via ORAL

## 2014-07-21 MED ORDER — METHOCARBAMOL 500 MG PO TABS
500.0000 mg | ORAL_TABLET | Freq: Four times a day (QID) | ORAL | Status: DC | PRN
  Administered 2014-07-21 – 2014-07-25 (×8): 500 mg via ORAL
  Filled 2014-07-21 (×9): qty 1

## 2014-07-21 MED ORDER — NEOSTIGMINE METHYLSULFATE 10 MG/10ML IV SOLN
INTRAVENOUS | Status: DC | PRN
  Administered 2014-07-21: 5 mg via INTRAVENOUS

## 2014-07-21 MED ORDER — BUPROPION HCL ER (XL) 150 MG PO TB24
150.0000 mg | ORAL_TABLET | Freq: Every morning | ORAL | Status: DC
  Administered 2014-07-21 – 2014-07-25 (×5): 150 mg via ORAL
  Filled 2014-07-21 (×6): qty 1

## 2014-07-21 MED ORDER — PHENOL 1.4 % MT LIQD
1.0000 | OROMUCOSAL | Status: DC | PRN
  Filled 2014-07-21: qty 177

## 2014-07-21 MED ORDER — MEPERIDINE HCL 50 MG/ML IJ SOLN: 6.2500 mg | INTRAMUSCULAR | Status: DC | PRN

## 2014-07-21 MED ORDER — FENTANYL CITRATE 0.05 MG/ML IJ SOLN
INTRAMUSCULAR | Status: AC
  Filled 2014-07-21: qty 5

## 2014-07-21 MED ORDER — DIPHENHYDRAMINE HCL 12.5 MG/5ML PO ELIX: 12.5000 mg | ORAL_SOLUTION | ORAL | Status: DC | PRN

## 2014-07-21 MED ORDER — ACETAMINOPHEN 325 MG PO TABS
650.0000 mg | ORAL_TABLET | Freq: Four times a day (QID) | ORAL | Status: DC | PRN
  Administered 2014-07-22: 650 mg via ORAL
  Filled 2014-07-21: qty 2

## 2014-07-21 MED ORDER — INSULIN ASPART 100 UNIT/ML ~~LOC~~ SOLN
4.0000 [IU] | Freq: Three times a day (TID) | SUBCUTANEOUS | Status: DC
  Administered 2014-07-21 – 2014-07-25 (×11): 4 [IU] via SUBCUTANEOUS

## 2014-07-21 MED ORDER — NITROGLYCERIN 0.2 MG/HR TD PT24
0.2000 mg | MEDICATED_PATCH | Freq: Every day | TRANSDERMAL | Status: DC
Start: 2014-07-21 — End: 2014-07-25
  Administered 2014-07-21 – 2014-07-25 (×5): 0.2 mg via TRANSDERMAL
  Filled 2014-07-21 (×5): qty 1

## 2014-07-21 MED ORDER — ONDANSETRON HCL 4 MG/2ML IJ SOLN
INTRAMUSCULAR | Status: AC
  Filled 2014-07-21: qty 2

## 2014-07-21 MED ORDER — ONDANSETRON HCL 4 MG/2ML IJ SOLN
INTRAMUSCULAR | Status: DC | PRN
  Administered 2014-07-21: 4 mg via INTRAVENOUS

## 2014-07-21 MED ORDER — LIDOCAINE HCL 2 % EX GEL
CUTANEOUS | Status: AC
  Filled 2014-07-21: qty 10

## 2014-07-21 MED ORDER — GLYCOPYRROLATE 0.2 MG/ML IJ SOLN
INTRAMUSCULAR | Status: AC
  Filled 2014-07-21: qty 3

## 2014-07-21 MED ORDER — IOHEXOL 300 MG/ML  SOLN
INTRAMUSCULAR | Status: DC | PRN
  Administered 2014-07-21: 16 mL via URETHRAL

## 2014-07-21 MED ORDER — DEXAMETHASONE SODIUM PHOSPHATE 10 MG/ML IJ SOLN
INTRAMUSCULAR | Status: AC
  Filled 2014-07-21: qty 1

## 2014-07-21 MED ORDER — ACETAMINOPHEN 650 MG RE SUPP: 650.0000 mg | Freq: Four times a day (QID) | RECTAL | Status: DC | PRN

## 2014-07-21 MED ORDER — INSULIN DETEMIR 100 UNIT/ML ~~LOC~~ SOLN
50.0000 [IU] | Freq: Every day | SUBCUTANEOUS | Status: DC
  Administered 2014-07-22 – 2014-07-25 (×4): 50 [IU] via SUBCUTANEOUS
  Filled 2014-07-21 (×4): qty 0.5

## 2014-07-21 MED ORDER — LACTATED RINGERS IV SOLN
INTRAVENOUS | Status: DC | PRN
  Administered 2014-07-21 (×2): via INTRAVENOUS

## 2014-07-21 MED ORDER — SODIUM CHLORIDE 0.9 % IR SOLN
Status: DC | PRN
  Administered 2014-07-21: 6000 mL

## 2014-07-21 MED ORDER — SODIUM CHLORIDE 0.9 % IV SOLN
INTRAVENOUS | Status: DC
  Administered 2014-07-21: 13:00:00 via INTRAVENOUS

## 2014-07-21 MED ORDER — LORAZEPAM 0.5 MG PO TABS
0.5000 mg | ORAL_TABLET | Freq: Three times a day (TID) | ORAL | Status: DC | PRN
  Administered 2014-07-24 – 2014-07-25 (×2): 0.5 mg via ORAL
  Filled 2014-07-21 (×2): qty 1

## 2014-07-21 MED ORDER — AMLODIPINE BESYLATE 5 MG PO TABS
5.0000 mg | ORAL_TABLET | Freq: Every morning | ORAL | Status: DC
  Administered 2014-07-21 – 2014-07-25 (×5): 5 mg via ORAL
  Filled 2014-07-21 (×5): qty 1

## 2014-07-21 MED ORDER — HYDROMORPHONE HCL PF 1 MG/ML IJ SOLN
0.2500 mg | INTRAMUSCULAR | Status: DC | PRN
  Administered 2014-07-21 (×4): 0.5 mg via INTRAVENOUS

## 2014-07-21 MED ORDER — CEFAZOLIN SODIUM-DEXTROSE 2-3 GM-% IV SOLR
2.0000 g | Freq: Four times a day (QID) | INTRAVENOUS | Status: AC
  Administered 2014-07-21 (×2): 2 g via INTRAVENOUS
  Filled 2014-07-21 (×2): qty 50

## 2014-07-21 MED ORDER — FERROUS SULFATE 325 (65 FE) MG PO TABS
325.0000 mg | ORAL_TABLET | Freq: Three times a day (TID) | ORAL | Status: DC
  Administered 2014-07-21 – 2014-07-25 (×11): 325 mg via ORAL
  Filled 2014-07-21 (×14): qty 1

## 2014-07-21 MED ORDER — SUCCINYLCHOLINE CHLORIDE 20 MG/ML IJ SOLN
INTRAMUSCULAR | Status: DC | PRN
  Administered 2014-07-21: 140 mg via INTRAVENOUS

## 2014-07-21 MED ORDER — PROPOFOL 10 MG/ML IV BOLUS
INTRAVENOUS | Status: DC | PRN
  Administered 2014-07-21: 300 mg via INTRAVENOUS

## 2014-07-21 MED ORDER — INSULIN ASPART 100 UNIT/ML ~~LOC~~ SOLN
0.0000 [IU] | Freq: Three times a day (TID) | SUBCUTANEOUS | Status: DC
  Administered 2014-07-21: 11 [IU] via SUBCUTANEOUS
  Administered 2014-07-22: 4 [IU] via SUBCUTANEOUS
  Administered 2014-07-23 – 2014-07-25 (×4): 3 [IU] via SUBCUTANEOUS

## 2014-07-21 MED ORDER — ALUM & MAG HYDROXIDE-SIMETH 200-200-20 MG/5ML PO SUSP: 30.0000 mL | ORAL | Status: DC | PRN

## 2014-07-21 MED ORDER — CARVEDILOL 6.25 MG PO TABS
6.2500 mg | ORAL_TABLET | Freq: Once | ORAL | Status: AC
  Administered 2014-07-21: 6.25 mg via ORAL
  Filled 2014-07-21 (×2): qty 1

## 2014-07-21 MED ORDER — PROMETHAZINE HCL 25 MG/ML IJ SOLN: 6.2500 mg | INTRAMUSCULAR | Status: DC | PRN

## 2014-07-21 MED ORDER — LIDOCAINE HCL (CARDIAC) 20 MG/ML IV SOLN
INTRAVENOUS | Status: DC | PRN
  Administered 2014-07-21: 100 mg via INTRAVENOUS

## 2014-07-21 MED ORDER — KETAMINE HCL 10 MG/ML IJ SOLN
INTRAMUSCULAR | Status: DC | PRN
  Administered 2014-07-21: 50 mg via INTRAVENOUS

## 2014-07-21 MED ORDER — MENTHOL 3 MG MT LOZG
1.0000 | LOZENGE | OROMUCOSAL | Status: DC | PRN
  Filled 2014-07-21: qty 9

## 2014-07-21 SURGICAL SUPPLY — 65 items
ARTICULEZE HEAD 36 12 (Hips) ×3 IMPLANT
ARTICULEZE HEAD 36MM 12 (Hips) ×1 IMPLANT
BAG SPEC THK2 15X12 ZIP CLS (MISCELLANEOUS) ×8
BAG URO CATCHER STRL LF (DRAPE) ×4 IMPLANT
BAG ZIPLOCK 12X15 (MISCELLANEOUS) ×16 IMPLANT
BALLN NEPHROSTOMY (BALLOONS) ×4
BALLOON NEPHROSTOMY (BALLOONS) IMPLANT
BLADE EXTENDED COATED 6.5IN (ELECTRODE) ×4 IMPLANT
BLADE SAW SAG 73X25 THK (BLADE)
BLADE SAW SGTL 73X25 THK (BLADE) ×2 IMPLANT
BRUSH FEMORAL CANAL (MISCELLANEOUS) ×4 IMPLANT
CATH FOLEY 2W COUNCIL 5CC 16FR (CATHETERS) ×3 IMPLANT
CATH INTERMIT  6FR 70CM (CATHETERS) ×2 IMPLANT
CLOTH BEACON ORANGE TIMEOUT ST (SAFETY) ×4 IMPLANT
DRAPE CAMERA CLOSED 9X96 (DRAPES) ×4 IMPLANT
DRAPE HIP W/POCKET STRL (DRAPE) ×4 IMPLANT
DRAPE INCISE IOBAN 85X60 (DRAPES) ×2 IMPLANT
DRAPE POUCH INSTRU U-SHP 10X18 (DRAPES) ×4 IMPLANT
DRAPE SURG 17X11 SM STRL (DRAPES) ×4 IMPLANT
DRAPE U-SHAPE 47X51 STRL (DRAPES) ×4 IMPLANT
DRSG AQUACEL AG ADV 3.5X14 (GAUZE/BANDAGES/DRESSINGS) ×4 IMPLANT
DRSG XEROFORM 1X8 (GAUZE/BANDAGES/DRESSINGS) ×2 IMPLANT
ELECT REM PT RETURN 9FT ADLT (ELECTROSURGICAL) ×4
ELECTRODE REM PT RTRN 9FT ADLT (ELECTROSURGICAL) ×2 IMPLANT
EVACUATOR 1/8 PVC DRAIN (DRAIN) ×2 IMPLANT
FACESHIELD WRAPAROUND (MASK) ×20 IMPLANT
FACESHIELD WRAPAROUND OR TEAM (MASK) ×8 IMPLANT
GAUZE XEROFORM 1X8 LF (GAUZE/BANDAGES/DRESSINGS) ×4 IMPLANT
GLOVE BIO SURGEON STRL SZ7.5 (GLOVE) ×12 IMPLANT
GLOVE BIOGEL M STRL SZ7.5 (GLOVE) ×7 IMPLANT
GLOVE BIOGEL PI IND STRL 6.5 (GLOVE) IMPLANT
GLOVE BIOGEL PI IND STRL 7.5 (GLOVE) ×4 IMPLANT
GLOVE BIOGEL PI IND STRL 8 (GLOVE) ×2 IMPLANT
GLOVE BIOGEL PI INDICATOR 6.5 (GLOVE) ×2
GLOVE BIOGEL PI INDICATOR 7.5 (GLOVE) ×4
GLOVE BIOGEL PI INDICATOR 8 (GLOVE) ×4
GLOVE ECLIPSE 8.0 STRL XLNG CF (GLOVE) ×4 IMPLANT
GLOVE SURG SS PI 7.5 STRL IVOR (GLOVE) ×2 IMPLANT
GOWN BRE IMP PREV XXLGXLNG (GOWN DISPOSABLE) ×2 IMPLANT
GOWN STRL REUS W/TWL XL LVL3 (GOWN DISPOSABLE) ×14 IMPLANT
GUIDEWIRE STR DUAL SENSOR (WIRE) ×3 IMPLANT
HANDPIECE INTERPULSE COAX TIP (DISPOSABLE) ×4
HEAD ARTICULEZE 36 12 (Hips) IMPLANT
HEAD M SROM 36MM PLUS 1.5 (Hips) IMPLANT
MANIFOLD NEPTUNE II (INSTRUMENTS) ×8 IMPLANT
PACK CYSTO (CUSTOM PROCEDURE TRAY) ×4 IMPLANT
PACK TOTAL JOINT (CUSTOM PROCEDURE TRAY) ×4 IMPLANT
POSITIONER SURGICAL ARM (MISCELLANEOUS) ×4 IMPLANT
SET HNDPC FAN SPRY TIP SCT (DISPOSABLE) IMPLANT
SLEEVE SURGEON STRL (DRAPES) ×2 IMPLANT
SPONGE LAP 18X18 X RAY DECT (DISPOSABLE) ×8 IMPLANT
SROM M HEAD 36MM PLUS 1.5 (Hips) ×4 IMPLANT
STAPLER VISISTAT 35W (STAPLE) ×6 IMPLANT
STEM FEMORAL SM 22.5MM HIP (Stem) ×2 IMPLANT
STEM FEMORAL SM STR 22.5MM HIP (Stem) ×2 IMPLANT
SUT ETHIBOND NAB CT1 #1 30IN (SUTURE) ×12 IMPLANT
SUT MNCRL AB 4-0 PS2 18 (SUTURE) IMPLANT
SUT VIC AB 0 CT1 27 (SUTURE) ×4
SUT VIC AB 0 CT1 27XBRD ANTBC (SUTURE) ×2 IMPLANT
SUT VIC AB 1 CT1 36 (SUTURE) ×8 IMPLANT
SUT VIC AB 2-0 CT1 27 (SUTURE) ×16
SUT VIC AB 2-0 CT1 TAPERPNT 27 (SUTURE) ×8 IMPLANT
SYRINGE 12CC LL (MISCELLANEOUS) ×3 IMPLANT
TOWEL OR 17X26 10 PK STRL BLUE (TOWEL DISPOSABLE) ×10 IMPLANT
TOWEL OR NON WOVEN STRL DISP B (DISPOSABLE) ×4 IMPLANT

## 2014-07-21 NOTE — Progress Notes (Signed)
Pt placed on auto CPAP with 3L O2 bleed in. Pt is using full face home mask.

## 2014-07-21 NOTE — Plan of Care (Signed)
Problem: Consults Goal: Diagnosis- Total Joint Replacement Primary Total Hip     

## 2014-07-21 NOTE — Anesthesia Preprocedure Evaluation (Addendum)
Anesthesia Evaluation  Patient identified by MRN, date of birth, ID band Patient awake    Reviewed: Allergy & Precautions, H&P , NPO status , Patient's Chart, lab work & pertinent test results  Airway Mallampati: II TM Distance: >3 FB Neck ROM: Full    Dental no notable dental hx.    Pulmonary sleep apnea and Continuous Positive Airway Pressure Ventilation , former smoker,  breath sounds clear to auscultation  Pulmonary exam normal       Cardiovascular hypertension, Pt. on medications + dysrhythmias Atrial Fibrillation Rhythm:Regular Rate:Normal     Neuro/Psych Anxiety Depression negative neurological ROS     GI/Hepatic negative GI ROS, Neg liver ROS,   Endo/Other  diabetes, Type 2, Insulin Dependent, Oral Hypoglycemic AgentsMorbid obesity  Renal/GU negative Renal ROS  negative genitourinary   Musculoskeletal negative musculoskeletal ROS (+)   Abdominal   Peds negative pediatric ROS (+)  Hematology negative hematology ROS (+)   Anesthesia Other Findings   Reproductive/Obstetrics negative OB ROS                          Anesthesia Physical  Anesthesia Plan  ASA: III  Anesthesia Plan: General   Post-op Pain Management:    Induction: Intravenous  Airway Management Planned: Oral ETT  Additional Equipment:   Intra-op Plan:   Post-operative Plan: Extubation in OR  Informed Consent: I have reviewed the patients History and Physical, chart, labs and discussed the procedure including the risks, benefits and alternatives for the proposed anesthesia with the patient or authorized representative who has indicated his/her understanding and acceptance.   Dental advisory given  Plan Discussed with: Surgeon and CRNA  Anesthesia Plan Comments:         Anesthesia Quick Evaluation

## 2014-07-21 NOTE — Op Note (Signed)
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 [name]  This patient I know very well Dr. Magnus Ivan as. He a Foley catheter prior to surgery. He consented prior to surgery.  Using DeBakey forceps I retracted the penis. I put on stretch. I spread the glans penis a little bit with a hemostat atraumatically. I could see the urethral meatus. I passed a sensor wire to opening ureteral catheter to the mid bulbar urethra. I removed a sensor wire. Retrograde ureterogram and there was some stricture in that area and dye reach the bladder.  With the penis on stretch I passed a sensor wire up in the bladder curling nicely. I then passed the open-end ureteral catheter all away off and and then removed it  I passed the balloon dilation catheter to the bladder neck using the marker. With the usual technique I dilated for 5 minutes at 18 atmospheres of water 24 [place]. I emptied the balloon and pulled back to the meatus. I were able to the procedure.  Remove the deflated the balloon dilation catheter. With the penis on stretch and a moderate amount of difficulty I inserted a 16 [place] council tip catheter curling nicely in the bladder with urine return. Cath to be left in for approximately 48 hours or longer

## 2014-07-21 NOTE — Transfer of Care (Signed)
Immediate Anesthesia Transfer of Care Note  Patient: Jack Barry  Procedure(s) Performed: Procedure(s) (LRB): RIGHT TOTAL HIP REVISION FEMORAL COMPONENT  (Right) CYSTOSCOPY, BALLOON DILATION, RETROGRADE URETHROGRAM (N/A)  Patient Location: PACU  Anesthesia Type: General  Level of Consciousness: sedated, patient cooperative and responds to stimulation  Airway & Oxygen Therapy: Patient Spontanous Breathing and Patient connected to face mask oxgen  Post-op Assessment: Report given to PACU RN and Post -op Vital signs reviewed and stable  Post vital signs: Reviewed and stable  Complications: No apparent anesthesia complications

## 2014-07-21 NOTE — Anesthesia Postprocedure Evaluation (Signed)
  Anesthesia Post-op Note  Patient: Jack Barry  Procedure(s) Performed: Procedure(s) (LRB): RIGHT TOTAL HIP REVISION FEMORAL COMPONENT  (Right) CYSTOSCOPY, BALLOON DILATION, RETROGRADE URETHROGRAM (N/A)  Patient Location: PACU  Anesthesia Type: Spinal  Level of Consciousness: awake and alert   Airway and Oxygen Therapy: Patient Spontanous Breathing  Post-op Pain: mild  Post-op Assessment: Post-op Vital signs reviewed, Patient's Cardiovascular Status Stable, Respiratory Function Stable, Patent Airway and No signs of Nausea or vomiting  Last Vitals:  Filed Vitals:   07/21/14 1629  BP: 137/73  Pulse: 79  Temp: 36.4 C  Resp: 16    Post-op Vital Signs: stable   Complications: No apparent anesthesia complications

## 2014-07-21 NOTE — Brief Op Note (Signed)
07/21/2014  11:27 AM  PATIENT:  Jack Barry  58 y.o. male  PRE-OPERATIVE DIAGNOSIS:  Stricture of urethra, bladder neck contracture Post excision arthroplasty right total hip  POST-OPERATIVE DIAGNOSIS:  Stricture of urethra, bladder neck contracture Post excision arthroplasty right total hip  PROCEDURE:  Procedure(s): RIGHT TOTAL HIP REVISION FEMORAL COMPONENT  (Right) CYSTOSCOPY, BALLOON DILATION, RETROGRADE URETHROGRAM (N/A)  SURGEON:  Surgeon(s) and Role: Panel 1:    * Kathryne Hitch, MD - Primary  Panel 2:    * Martina Sinner, MD - Primary  PHYSICIAN ASSISTANT: Rexene Edison, PA-C  ANESTHESIA:   general  EBL:  Total I/O In: 3000 [I.V.:3000] Out: 1650 [Urine:150; Blood:1500]  BLOOD ADMINISTERED:none  DRAINS: none   LOCAL MEDICATIONS USED:  NONE  COUNTS:  YES  TOURNIQUET:  * No tourniquets in log *  DICTATION: .Other Dictation: Dictation Number [id number]  PLAN OF CARE: Admit to inpatient   PATIENT DISPOSITION:  PACU - hemodynamically stable.   Delay start of Pharmacological VTE agent (>24hrs) due to surgical blood loss or risk of bleeding: no

## 2014-07-21 NOTE — Progress Notes (Signed)
PT Cancellation Note  Patient Details Name: Jack Barry MRN: [id number] DOB: 1956/04/20   Cancelled Treatment:    Reason Eval/Treat Not Completed: Pain limiting ability to participate   [name] 07/21/2014, 4:58 PM [name] PT [phone]

## 2014-07-21 NOTE — H&P (Signed)
Jack Barry is an 58 y.o. male.   Chief Complaint:   Failed right total hip replacement; here for replantation of femoral component HPI:   58 yo male who underwent a right total hip replacement in 2011.  Presented at the end of last year and early this year with worsening right hip pain.  After extensive workup, was found to have aseptic loosing of the femoral component.  Underwent removal of that component and placement of a temporary antibiotic spacer a few months ago and still treated as an infection due to his chronic UTI's and bouts of pneumonia.  He now presents for removal of hs temporary femoral spacer and revision to a new femoral component.  He understands fully the risks and benefits involved.  Past Medical History  Diagnosis Date  . Hypertension   . A-fib   . Cellulitis     BLE  . Anxiety   . Hypercholesterolemia   . Pneumonia     "several times"  . Chronic bronchitis     "gets it q yr" (04/19/2014)  . OSA on CPAP   . Type 1 diabetes mellitus   . GERD (gastroesophageal reflux disease)     "little bit" (04/19/2014)  . Depression   . Recurrent UTI (urinary tract infection)   . Chronic kidney disease     Past Surgical History  Procedure Laterality Date  . Hip arthroplasty Right 2011 X 2    "couldn't finish @ [facility]; sent him to [facility] to finish the next week"  . Anterior cervical decomp/discectomy fusion  01/2006  . Total shoulder arthroplasty Right 02/2008  . Colonoscopy    . Tonsillectomy    . Urethral dilation      "multiple"  . Excisional total hip arthroplasty with antibiotic spacers Right 04/18/2014  . Balloon dilation  04/18/2014    CYSTOSCOPY WITH BALLOON DILATION AND RETROGRADE URETHROGRAM  . Incision and drainage hip Right 03/2010  . Excisional total hip arthroplasty with antibiotic spacers Right 04/18/2014    Procedure: EXCISIONAL RIGHT TOTAL HIP ARTHROPLASTY WITH PLACEMENT OF TEMPORARY ANTIBIOTIC SPACERS;  Surgeon: Mcarthur Rossetti, MD;  Location: [facility];   Service: Orthopedics;  Laterality: Right;  . Cystoscopy with retrograde urethrogram N/A 04/18/2014    Procedure: CYSTOSCOPY WITH BALLOON DILATION AND RETROGRADE URETHROGRAM;  Surgeon: Reece Packer, MD;  Location: [facility];  Service: Urology;  Laterality: N/A;  . Balloon dilation N/A 04/18/2014    Procedure: BALLOON DILATION;  Surgeon: Reece Packer, MD;  Location: [facility];  Service: Urology;  Laterality: N/A;    Family History  Problem Relation Age of Onset  . Cancer Mother   . Heart attack Father   . Diabetes Other    Social History:  reports that he has quit smoking. His smoking use included Cigarettes. He has a 40 pack-year smoking history. He has never used smokeless tobacco. He reports that he does not drink alcohol or use illicit drugs.  Allergies:  Allergies  Allergen Reactions  . Ciprofloxacin Itching  . Sulfa Antibiotics Hives  . Tape Rash    Can use paper tape    Medications Prior to Admission  Medication Sig Dispense Refill  . amLODipine (NORVASC) 5 MG tablet Take 5 mg by mouth every morning.       Marland Kitchen buPROPion (WELLBUTRIN XL) 150 MG 24 hr tablet Take 150 mg by mouth every morning.       . carvedilol (COREG) 6.25 MG tablet Take 6.25 mg by mouth 2 (two)  times daily with a meal.      . citalopram (CELEXA) 40 MG tablet Take 40 mg by mouth every morning.       . diltiazem (DILACOR XR) 180 MG 24 hr capsule Take 180 mg by mouth 2 (two) times daily.      Marland Kitchen gemfibrozil (LOPID) 600 MG tablet Take 600 mg by mouth 2 (two) times daily before a meal.      . Glucosamine HCl 1000 MG TABS Take 1 tablet by mouth every morning.      Marland Kitchen HYDROcodone-acetaminophen (NORCO) 10-325 MG per tablet Take 1 tablet by mouth every 6 (six) hours as needed for severe pain.  90 tablet  0  . insulin detemir (LEVEMIR) 100 UNIT/ML injection Inject 50 Units into the skin daily.      . insulin lispro (HUMALOG) 100 UNIT/ML injection Inject 10 Units into the skin 3 (three) times daily before meals.      Marland Kitchen  LORazepam (ATIVAN) 0.5 MG tablet Take 0.5 mg by mouth every 8 (eight) hours as needed for anxiety.      . Omega-3 Fatty Acids (FISH OIL) 1000 MG CAPS Take 1,000 mg by mouth daily.      . pantoprazole (PROTONIX) 40 MG tablet Take 40 mg by mouth every morning.       . sotalol (BETAPACE) 120 MG tablet Take 120 mg by mouth 2 (two) times daily.      Marland Kitchen aspirin EC 325 MG EC tablet Take 1 tablet (325 mg total) by mouth 2 (two) times daily after a meal.  60 tablet  0  . aspirin EC 325 MG tablet Take 325 mg by mouth daily.      . nitroGLYCERIN (NITRODUR - DOSED IN MG/24 HR) 0.2 mg/hr patch Place 0.2 mg onto the skin daily.      Marland Kitchen testosterone cypionate (DEPOTESTOTERONE CYPIONATE) 200 MG/ML injection Inject 200 mg into the muscle every 7 (seven) days.      Marland Kitchen tiZANidine (ZANAFLEX) 4 MG tablet Take 4 mg by mouth every 8 (eight) hours as needed for muscle spasms.        Results for orders placed during the hospital encounter of 07/21/14 (from the past 48 hour(s))  URINALYSIS, ROUTINE W REFLEX MICROSCOPIC     Status: Abnormal   Collection Time    07/21/14  5:22 AM      Result Value Ref Range   Color, Urine YELLOW  YELLOW   APPearance CLEAR  CLEAR   Specific Gravity, Urine 1.018  1.005 - 1.030   pH 6.0  5.0 - 8.0   Glucose, UA NEGATIVE  NEGATIVE mg/dL   Hgb urine dipstick TRACE (*) NEGATIVE   Bilirubin Urine NEGATIVE  NEGATIVE   Ketones, ur NEGATIVE  NEGATIVE mg/dL   Protein, ur >300 (*) NEGATIVE mg/dL   Urobilinogen, UA 0.2  0.0 - 1.0 mg/dL   Nitrite NEGATIVE  NEGATIVE   Leukocytes, UA NEGATIVE  NEGATIVE  URINE MICROSCOPIC-ADD ON     Status: None   Collection Time    07/21/14  5:22 AM      Result Value Ref Range   WBC, UA 3-6  <3 WBC/hpf   RBC / HPF 0-2  <3 RBC/hpf  GLUCOSE, CAPILLARY     Status: Abnormal   Collection Time    07/21/14  5:27 AM      Result Value Ref Range   Glucose-Capillary 142 (*) 70 - 99 mg/dL   Comment 1 Notify RN    [employer]  Status: None   Collection Time     07/21/14  5:55 AM      Result Value Ref Range   Prothrombin Time 14.0  11.6 - 15.2 seconds   INR 1.08  0.00 - 1.49  APTT     Status: None   Collection Time    07/21/14  5:55 AM      Result Value Ref Range   aPTT 34  24 - 37 seconds  BASIC METABOLIC PANEL     Status: Abnormal   Collection Time    07/21/14  5:55 AM      Result Value Ref Range   Sodium 141  137 - 147 mEq/L   Potassium 4.5  3.7 - 5.3 mEq/L   Chloride 100  96 - 112 mEq/L   CO2 28  19 - 32 mEq/L   Glucose, Bld 142 (*) 70 - 99 mg/dL   BUN 30 (*) 6 - 23 mg/dL   Creatinine, Ser 1.53 (*) 0.50 - 1.35 mg/dL   Calcium 9.7  8.4 - 10.5 mg/dL   GFR calc non Af Amer 49 (*) >90 mL/min   GFR calc Af Amer 57 (*) >90 mL/min   Comment: (NOTE)     The eGFR has been calculated using the CKD EPI equation.     This calculation has not been validated in all clinical situations.     eGFR's persistently <90 mL/min signify possible Chronic Kidney     Disease.   Anion gap 13  5 - 15  CBC     Status: Abnormal   Collection Time    07/21/14  5:55 AM      Result Value Ref Range   WBC 10.2  4.0 - 10.5 K/uL   RBC 4.76  4.22 - 5.81 MIL/uL   Hemoglobin 12.8 (*) 13.0 - 17.0 g/dL   HCT 39.8  39.0 - 52.0 %   MCV 83.6  78.0 - 100.0 fL   MCH 26.9  26.0 - 34.0 pg   MCHC 32.2  30.0 - 36.0 g/dL   RDW 14.0  11.5 - 15.5 %   Platelets 402 (*) 150 - 400 K/uL  TYPE AND SCREEN     Status: None   Collection Time    07/21/14  6:05 AM      Result Value Ref Range   ABO/RH(D) A POS     Antibody Screen NEG     Sample Expiration 07/24/2014     No results found.  ROS  Blood pressure 169/88, pulse 63, temperature 98.2 F (36.8 C), temperature source Oral, resp. rate 16, height 6' 2"  (1.88 m), weight 157.852 kg (348 lb), SpO2 95.00%. Physical Exam  Constitutional: He is oriented to person, place, and time. He appears well-developed and well-nourished.  HENT:  Head: Normocephalic and atraumatic.  Eyes: EOM are normal. Pupils are equal, round, and  reactive to light.  Neck: Normal range of motion. Neck supple.  Cardiovascular: Normal rate and regular rhythm.   Respiratory: Effort normal and breath sounds normal.  GI: Soft. Bowel sounds are normal.  Musculoskeletal:       Right hip: He exhibits decreased range of motion and decreased strength.  Neurological: He is alert and oriented to person, place, and time.  Skin: Skin is warm and dry.  Psychiatric: He has a normal mood and affect.     Assessment/Plan 2nd stage revision arthroplasty right hip due to prosthetic asepctic loosening and questionable infection 1)  To the OR today for removal  of temporary right hip spacer and placement of new femoral component.  Gurbani Figge Y 07/21/2014, 7:12 AM

## 2014-07-22 LAB — CBC
HCT: 26 % — ABNORMAL LOW (ref 39.0–52.0)
Hemoglobin: 8.4 g/dL — ABNORMAL LOW (ref 13.0–17.0)
MCH: 27 pg (ref 26.0–34.0)
MCHC: 32.3 g/dL (ref 30.0–36.0)
MCV: 83.6 fL (ref 78.0–100.0)
Platelets: 351 10*3/uL (ref 150–400)
RBC: 3.11 MIL/uL — ABNORMAL LOW (ref 4.22–5.81)
RDW: 14.3 % (ref 11.5–15.5)
WBC: 15.5 10*3/uL — ABNORMAL HIGH (ref 4.0–10.5)

## 2014-07-22 LAB — BASIC METABOLIC PANEL
Anion gap: 13 (ref 5–15)
BUN: 31 mg/dL — ABNORMAL HIGH (ref 6–23)
CO2: 26 mEq/L (ref 19–32)
Calcium: 8.5 mg/dL (ref 8.4–10.5)
Chloride: 97 mEq/L (ref 96–112)
Creatinine, Ser: 1.53 mg/dL — ABNORMAL HIGH (ref 0.50–1.35)
GFR calc Af Amer: 57 mL/min — ABNORMAL LOW (ref >90)
GFR calc non Af Amer: 49 mL/min — ABNORMAL LOW (ref >90)
Glucose, Bld: 143 mg/dL — ABNORMAL HIGH (ref 70–99)
Potassium: 4.3 mEq/L (ref 3.7–5.3)
Sodium: 136 mEq/L — ABNORMAL LOW (ref 137–147)

## 2014-07-22 LAB — GLUCOSE, CAPILLARY
Glucose-Capillary: 148 mg/dL — ABNORMAL HIGH (ref 70–99)
Glucose-Capillary: 116 mg/dL — ABNORMAL HIGH (ref 70–99)
Glucose-Capillary: 94 mg/dL (ref 70–99)

## 2014-07-22 NOTE — Evaluation (Signed)
Physical Therapy Evaluation Patient Details Name: Jack Barry MRN: [id number] DOB: 25-Jan-1956 Today's Date: 07/22/2014   History of Present Illness  Reimplantation R THR   Clinical Impression  Pt s/p R THR reimplantation presents with decreased R LE strength/ROM, post op pain and ant THP limiting functional mobility.  Pt should progress to d/c home with family assist and HHPT follow up.    Follow Up Recommendations Home health PT    Equipment Recommendations  None recommended by PT    Recommendations for Other Services OT consult     Precautions / Restrictions Precautions Precautions: Anterior Hip;Fall Precaution Booklet Issued: Yes (comment) Precaution Comments: Sign hung in room Restrictions Weight Bearing Restrictions: No Other Position/Activity Restrictions: WBAT      Mobility  Bed Mobility Overal bed mobility: +2 for physical assistance;Needs Assistance Bed Mobility: Supine to Sit     Supine to sit: Mod assist;+2 for physical assistance;+2 for safety/equipment     General bed mobility comments: cues for sequence and use of L LE to self assist.  Physical assist for R LE management and to bring trunk to upright position  Transfers Overall transfer level: Needs assistance Equipment used: Rolling walker (2 wheeled) Transfers: Sit to/from Stand Sit to Stand: From elevated surface;+2 safety/equipment;Min assist         General transfer comment: cues for LE management and use of UEs to self assist  Ambulation/Gait Ambulation/Gait assistance: Min assist;+2 safety/equipment Ambulation Distance (Feet): 46 Feet Assistive device: Rolling walker (2 wheeled) Gait Pattern/deviations: Step-to pattern;Decreased step length - right;Decreased step length - left;Shuffle;Trunk flexed Gait velocity: decr   General Gait Details: cues for posture, position from RW and sequence  Stairs            Wheelchair Mobility    Modified Rankin (Stroke Patients Only)        Balance                                             Pertinent Vitals/Pain Pain Assessment: 0-10 Pain Score: 3  Pain Location: R hip Pain Descriptors / Indicators: Aching;Sore Pain Intervention(s): Limited activity within patient's tolerance;Monitored during session;Premedicated before session;Ice applied    Home Living Family/patient expects to be discharged to:: Private residence Living Arrangements: Spouse/significant other Available Help at Discharge: Family;Available PRN/intermittently Type of Home: House Home Access: Stairs to enter Entrance Stairs-Rails: None Entrance Stairs-Number of Steps: 1+1 Home Layout: One level Home Equipment: Walker - 2 wheels;Bedside commode;Tub bench      Prior Function Level of Independence: Needs assistance   Gait / Transfers Assistance Needed: rolling walker  ADL's / Homemaking Assistance Needed: wife assisted with bath/dress  Comments: Pt states "the RW was my best friend"     Hand Dominance   Dominant Hand: Right    Extremity/Trunk Assessment   Upper Extremity Assessment: Overall WFL for tasks assessed           Lower Extremity Assessment: RLE deficits/detail RLE Deficits / Details: Hip strength 2/5 with AAROM at hip to 60 flex    Cervical / Trunk Assessment: Normal  Communication   Communication: No difficulties  Cognition Arousal/Alertness: Awake/alert Behavior During Therapy: WFL for tasks assessed/performed Overall Cognitive Status: Within Functional Limits for tasks assessed                      General Comments  Exercises Total Joint Exercises Ankle Circles/Pumps: AROM;Both;15 reps;Supine Heel Slides: AAROM;15 reps;Right;Supine      Assessment/Plan    PT Assessment Patient needs continued PT services  PT Diagnosis Difficulty walking   PT Problem List Decreased strength;Decreased range of motion;Decreased activity tolerance;Decreased mobility;Decreased knowledge of  use of DME;Obesity;Pain;Decreased knowledge of precautions  PT Treatment Interventions DME instruction;Gait training;Stair training;Functional mobility training;Therapeutic activities;Therapeutic exercise;Patient/family education   PT Goals (Current goals can be found in the Care Plan section) Acute Rehab PT Goals Patient Stated Goal: HOme PT Goal Formulation: With patient Time For Goal Achievement: 07/29/14 Potential to Achieve Goals: Good    Frequency 7X/week   Barriers to discharge        Co-evaluation               End of Session   Activity Tolerance: Patient tolerated treatment well Patient left: with call bell/phone within reach;in chair Nurse Communication: Mobility status         Time: 1610-9604 PT Time Calculation (min): 40 min   Charges:   PT Evaluation $Initial PT Evaluation Tier I: 1 Procedure PT Treatments $Gait Training: 8-22 mins $Therapeutic Activity: 8-22 mins   PT G Codes:          Ediberto Sens 07/22/2014, 1:04 PM

## 2014-07-22 NOTE — Op Note (Signed)
NAMEMarland Kitchen  Jack Barry, Jack NO.:  [id number]  MEDICAL RECORD NO.:  [id number]  LOCATION:  1611                         FACILITY:  [facility]  PHYSICIAN:  Vanita Panda. 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  PROCEDURE:  Revision arthroplasty, right total-hip, one component only with revision of femoral component.  FINDINGS:  Once again, no evidence of infection at all with removal of a temporary Prostalac antibiotic spacer.  SURGEON:  Doneen Poisson, MD.  ASSISTANT:  Richardean Canal, PA-C.  ANESTHESIA:  General.  BLOOD LOSS:  1300-1500 mL.  COMPLICATIONS:  None.  INDICATIONS:  Jack Barry is a very pleasant 58 year old morbidly obese individual, who had a right total hip arthroplasty back in 2011. At that time, he had the biggest AML DePuy femoral component placed which was 19.5.  Over the years, he developed aseptic loosening.  He has had a history of pneumonia and multiple urinary tract infections with the thought that this was an infection.  However, no aspirations ever showed any evidence of infection of the hip, and we took him to the operating room 3 months ago.  We found evidence of aseptic loosening, but no infection.  We still treated him with IV and then oral antibiotics, in case there was a latent infection.  He now presents for revision of the femoral component.  We kept the acetabular component in place.  PROCEDURE DESCRIPTION:  After informed consent was obtained, appropriate right hip was marked.  He was brought to the operating room, and Dr. Lorin Picket McDermott from Urology performed a cystoscope due to difficulty  of Foley placement that he has had a history due to his UTIs as well.  Once that portion of the case was done, we proceeded with the orthopedic portion of the case.  He was taken out of stirrups and was put back up for proceeding with our portion of the case.  He was turned into a lateral decubitus position with the right operative hip up.  His right hip was prepped and draped with DuraPrep and sterile drapes including sterile stockinette.  He had hip positioners placed in the front and the back.  A time-out was called identifying the correct patient, correct right hip.  We then made an incision directly over the lateral aspect of his hip through his previous incision earlier this year and dissected down to the iliotibial band.  We found no evidence of infection at these levels.  We irrigated out completely before opening up the IT band.  We then opened up the IT band and found no gross purulence at all.  We further irrigated this area.  We then dislocated the Prostalac hip and removed the hip ball, removed all cement debris and removed the Prostalac stem without difficulty.  We then used a brush to irrigate and irrigate the femoral canal and then began reaming up to a 21.5.  We trialed a 22.5 AML  short 6 inch stem and that was felt to be tight.  It was nice and proud.  We were able to reduce this with an 8.5 hip ball and I felt this had offered good stability.  I dislocated the hip, and we placed the real 22.5 short stature AML stem, but it subsided, and I felt we needed to go up in size and __________.  We had to remove that stem and hip ball and then we placed a 22.5 solution 8 inch stem with fully porous coating.  Once we put this real stem __________.  We put the real stem that had nice tight fit and then, we were able to trial a 36+ 1.5 hip ball and reducing the acetabulum, and it was stable.  So, we dislocated the hip and placed the real metal 36+ 1.5 hip ball, reduced this in the  acetabulum and again it was stable.  We copiously irrigated the soft tissue again with additional 3 L of normal saline solution for a total of 6 L irrigation through the hip.  We reapproximated the gluteus medius and minimus with interrupted #1 Ethibond suture with an IT band closure, #1 Ethibond and #1 Vicryl suture.  The deep tissue with 0 Vicryl, 2-0 Vicryl and subcutaneous tissue, and interrupted staples on the skin.  Well-padded sterile dressing was applied.  He was then awakened, extubated, and taken to the recovery room.     Vanita Panda. Magnus Ivan, M.D.     CYB/MEDQ  D:  07/21/2014  T:  07/21/2014  Job:  [id number]

## 2014-07-22 NOTE — Progress Notes (Signed)
Subjective: 1 Day Post-Op Procedure(s) (LRB): RIGHT TOTAL HIP REVISION FEMORAL COMPONENT  (Right) CYSTOSCOPY, BALLOON DILATION, RETROGRADE URETHROGRAM (N/A) Patient reports pain as moderate.  Acute blood loss anemia from surgery, but vitals stable.  Objective: Vital signs in last 24 hours: Temp:  [97.6 F (36.4 C)-98.6 F (37 C)] 98.1 F (36.7 C) (09/05 1008) Pulse Rate:  [71-81] 71 (09/05 1008) Resp:  [10-24] 18 (09/05 1008) BP: (112-150)/(62-117) 139/66 mmHg (09/05 1008) SpO2:  [92 %-99 %] 99 % (09/05 1008)  Intake/Output from previous day: 09/04 0701 - 09/05 0700 In: 6301.3 [P.O.:920; I.V.:5281.3; IV Piggyback:100] Out: 3150 [Urine:1650; Blood:1500] Intake/Output this shift: Total I/O In: 720 [P.O.:720] Out: 400 [Urine:400]   Recent Labs  07/21/14 0555 07/22/14 0505  HGB 12.8* 8.4*    Recent Labs  07/21/14 0555 07/22/14 0505  WBC 10.2 15.5*  RBC 4.76 3.11*  HCT 39.8 26.0*  PLT 402* 351    Recent Labs  07/21/14 0555 07/22/14 0505  NA 141 136*  K 4.5 4.3  CL 100 97  CO2 28 26  BUN 30* 31*  CREATININE 1.53* 1.53*  GLUCOSE 142* 143*  CALCIUM 9.7 8.5    Recent Labs  07/21/14 0555  INR 1.08    Sensation intact distally Intact pulses distally Dorsiflexion/Plantar flexion intact Incision: scant drainage  Assessment/Plan: 1 Day Post-Op Procedure(s) (LRB): RIGHT TOTAL HIP REVISION FEMORAL COMPONENT  (Right) CYSTOSCOPY, BALLOON DILATION, RETROGRADE URETHROGRAM (N/A) Up with therapy; strict anterior hip precautions. May need transfusion during this hospitalization. Leave foley in today per Urology; likely D/C foley tomorrow as mobilization improves.  Jack Barry Y 07/22/2014, 11:24 AM

## 2014-07-22 NOTE — Progress Notes (Signed)
Physical Therapy Treatment Patient Details Name: Jack Barry MRN: [id number] DOB: 1956/03/22 Today's Date: 07/22/2014    History of Present Illness Reimplantation R THR     PT Comments    POD # 1 pm session.  Assisted pt out of recliner to amb in hallway second time then positioned in recliner with ICE to R hip.    Follow Up Recommendations  Home health PT     Equipment Recommendations  None recommended by PT    Recommendations for Other Services       Precautions / Restrictions Precautions Precautions: Anterior Hip;Fall Precaution Comments: Sign hung in room Restrictions Weight Bearing Restrictions: No Other Position/Activity Restrictions: WBAT    Mobility  Bed Mobility               General bed mobility comments: Pt OOB in recliner  Transfers Overall transfer level: Needs assistance Equipment used: Rolling walker (2 wheeled) Transfers: Sit to/from Stand Sit to Stand: From elevated surface;+2 safety/equipment;Min assist         General transfer comment: cues for LE management and use of UEs to self assist plus increased time  Ambulation/Gait Ambulation/Gait assistance: +2 safety/equipment;Min guard;Min assist Ambulation Distance (Feet): 52 Feet Assistive device: Rolling walker (2 wheeled) Gait Pattern/deviations: Step-to pattern;Trunk flexed Gait velocity: decr   General Gait Details: <25% cues for posture, position from RW and sequence.  Increased time.   Stairs            Wheelchair Mobility    Modified Rankin (Stroke Patients Only)       Balance                                    Cognition                            Exercises      General Comments        Pertinent Vitals/Pain  c/o 5/10 pain    Home Living                      Prior Function            PT Goals (current goals can now be found in the care plan section) Progress towards PT goals: Progressing toward goals     Frequency  7X/week    PT Plan      Co-evaluation             End of Session Equipment Utilized During Treatment: Gait belt Activity Tolerance: Patient tolerated treatment well Patient left: with call bell/phone within reach;in chair     Time: 1335-1353 PT Time Calculation (min): 18 min  Charges:  $Gait Training: 8-22 mins                    G Codes:      Felecia Shelling  PTA [facility]  Acute  Rehab Pager      [phone]

## 2014-07-23 LAB — CBC
HEMATOCRIT: 24 % — AB (ref 39.0–52.0)
HEMOGLOBIN: 7.8 g/dL — AB (ref 13.0–17.0)
MCH: 27.5 pg (ref 26.0–34.0)
MCHC: 32.5 g/dL (ref 30.0–36.0)
MCV: 84.5 fL (ref 78.0–100.0)
Platelets: 322 10*3/uL (ref 150–400)
RBC: 2.84 MIL/uL — AB (ref 4.22–5.81)
RDW: 14.6 % (ref 11.5–15.5)
WBC: 15 10*3/uL — ABNORMAL HIGH (ref 4.0–10.5)

## 2014-07-23 LAB — GLUCOSE, CAPILLARY
Glucose-Capillary: 102 mg/dL — ABNORMAL HIGH (ref 70–99)
Glucose-Capillary: 138 mg/dL — ABNORMAL HIGH (ref 70–99)
Glucose-Capillary: 107 mg/dL — ABNORMAL HIGH (ref 70–99)
Glucose-Capillary: 141 mg/dL — ABNORMAL HIGH (ref 70–99)

## 2014-07-23 MED ORDER — ALBUTEROL SULFATE (2.5 MG/3ML) 0.083% IN NEBU
2.5000 mg | INHALATION_SOLUTION | Freq: Four times a day (QID) | RESPIRATORY_TRACT | Status: DC | PRN
  Administered 2014-07-23: 2.5 mg via RESPIRATORY_TRACT
  Filled 2014-07-23: qty 3

## 2014-07-23 MED ORDER — ALBUTEROL SULFATE (2.5 MG/3ML) 0.083% IN NEBU
2.5000 mg | INHALATION_SOLUTION | Freq: Three times a day (TID) | RESPIRATORY_TRACT | Status: DC
  Administered 2014-07-23 – 2014-07-25 (×5): 2.5 mg via RESPIRATORY_TRACT
  Filled 2014-07-23 (×5): qty 3

## 2014-07-23 NOTE — Evaluation (Signed)
Occupational Therapy Evaluation Patient Details Name: Jack Barry MRN: [id number] DOB: December 16, 1955 Today's Date: 07/23/2014    History of Present Illness 58 yo male admitted for Rt THR revision anterior hip precautions.    Clinical Impression   Patient is s/p RT THR revision with anterior precautions resulting in functional limitations due to the deficits listed below (see OT problem list). PTA wife (A) with bathing and dressing baseline. Patient will benefit from skilled OT acutely to increase independence and safety with ADLS to allow discharge HHOT. Ot to follow acutely for basic transfer with BSC. Pt instructed not to complete tub transfer until hHOT arrives to practice with precautions. Pt agreeable to sponge bath until this time.     Follow Up Recommendations  Home health OT;Supervision - Intermittent    Equipment Recommendations  None recommended by OT (has DME from previous surg)    Recommendations for Other Services       Precautions / Restrictions Precautions Precautions: Anterior Hip;Fall Precaution Comments: pt able to recall precautions Restrictions Other Position/Activity Restrictions: WBAT      Mobility Bed Mobility Overal bed mobility: Needs Assistance Bed Mobility: Supine to Sit;Sidelying to Sit;Rolling Rolling: Min guard Sidelying to sit: Min guard Supine to sit: Min guard;HOB elevated     General bed mobility comments: Pt required heavy use of bed rail and hob to lift into static sitting. pt plans to sleep in recliner and will not complete bed mobility at home. Pt has lift chair to help with elevation  Transfers Overall transfer level: Needs assistance Equipment used: Rolling walker (2 wheeled) Transfers: Sit to/from Stand Sit to Stand: Min guard         General transfer comment: pt with good hand placement and static standing min guard (A)    Balance Overall balance assessment: Needs assistance         Standing balance support: No  upper extremity supported;During functional activity Standing balance-Leahy Scale: Good                              ADL Overall ADL's : Needs assistance/impaired Eating/Feeding: Independent   Grooming: Wash/dry hands;Wash/dry face;Oral care;Supervision/safety;Standing   Upper Body Bathing: Supervision/ safety;Sitting   Lower Body Bathing: Moderate assistance;Sit to/from stand   Upper Body Dressing : Supervision/safety;Sitting   Lower Body Dressing: Moderate assistance;Sit to/from stand Lower Body Dressing Details (indicate cue type and reason): Pt able to complete peri care sitting  and static standing. Pt requires (A) for knee to feet. Toilet Transfer: Min guard;Ambulation;RW (elevated surface)   Toileting- Clothing Manipulation and Hygiene: Min guard;Sit to/from stand       Functional mobility during ADLs: Supervision/safety;Rolling walker General ADL Comments: Pt required HOB elevated to progress to eOB min guard (A). pt reports sleeping in recliner that is a lift chair at home. PT allowed to elevate bed surface to simulate chair lift at home. Pt completed bath at EOB and oral care static standing at sink surface. Pt pleasant and agreeable to [facility]     Vision                     Perception     Praxis      Pertinent Vitals/Pain Pain Assessment: No/denies pain Pain Score: 5  Pain Location: Rt hip Pain Intervention(s): Repositioned     Hand Dominance Right   Extremity/Trunk Assessment Upper Extremity Assessment Upper Extremity Assessment: Overall WFL for tasks assessed  Lower Extremity Assessment Lower Extremity Assessment: Defer to PT evaluation   Cervical / Trunk Assessment Cervical / Trunk Assessment: Normal   Communication Communication Communication: No difficulties   Cognition Arousal/Alertness: Awake/alert Behavior During Therapy: WFL for tasks assessed/performed Overall Cognitive Status: Within Functional Limits for tasks  assessed                     General Comments       Exercises       Shoulder Instructions      Home Living Family/patient expects to be discharged to:: Private residence Living Arrangements: Spouse/significant other Available Help at Discharge: Family;Available PRN/intermittently Type of Home: House Home Access: Stairs to enter Entrance Stairs-Number of Steps: 1+1 Entrance Stairs-Rails: None Home Layout: One level     Bathroom Shower/Tub: Chief Strategy Officer: Standard     Home Equipment: Environmental consultant - 2 wheels;Bedside commode;Tub bench          Prior Functioning/Environment Level of Independence: Needs assistance  Gait / Transfers Assistance Needed: rolling walker ADL's / Homemaking Assistance Needed: wife assisted with bath/dress        OT Diagnosis: Generalized weakness;Acute pain   OT Problem List: Decreased strength;Decreased activity tolerance;Impaired balance (sitting and/or standing);Decreased safety awareness;Decreased knowledge of use of DME or AE;Decreased knowledge of precautions;Pain;Obesity   OT Treatment/Interventions: Self-care/ADL training;Therapeutic exercise;DME and/or AE instruction;Therapeutic activities;Patient/family education;Balance training    OT Goals(Current goals can be found in the care plan section) Acute Rehab OT Goals Patient Stated Goal: HOme OT Goal Formulation: With patient Time For Goal Achievement: 08/06/14 Potential to Achieve Goals: Good  OT Frequency: Min 2X/week   Barriers to D/C:            Co-evaluation              End of Session Equipment Utilized During Treatment: Engineer, water Communication: Mobility status;Precautions  Activity Tolerance: Patient tolerated treatment well Patient left: in chair;with call bell/phone within reach;Other (comment) (MD in room)   Time: [id number] OT Time Calculation (min): 32 min Charges:  OT General Charges $OT Visit: 1 Procedure OT  Evaluation $Initial OT Evaluation Tier I: 1 Procedure OT Treatments $Self Care/Home Management : 23-37 mins G-Codes:    Jack Barry 08-03-14, 8:42 AM Pager: [phone]

## 2014-07-23 NOTE — Progress Notes (Signed)
CARE MANAGEMENT NOTE 07/23/2014  Patient:  Jack Barry, Jack Barry   Account Number:  [id number]  Date Initiated:  07/23/2014  Documentation initiated by:  [facility]  Subjective/Objective Assessment:   RIGHT TOTAL HIP REVISION FEMORAL COMPONENT     Action/Plan:   lives at home with wife   Anticipated DC Date:  07/24/2014   Anticipated DC Plan:  HOME W HOME HEALTH SERVICES      DC Planning Services  CM consult      [facility] Choice  HOME HEALTH   Choice offered to / List presented to:  C-1 Patient        HH arranged  HH-2 PT  HH-3 OT      [facility] agency  [facility]   Status of service:  Completed, signed off Medicare Important Message given?   (If response is "NO", the following Medicare IM given date fields will be blank) Date Medicare IM given:   Medicare IM given by:   Date Additional Medicare IM given:   Additional Medicare IM given by:    Discharge Disposition:  HOME W HOME HEALTH SERVICES  Per UR Regulation:    If discussed at Long Length of Stay Meetings, dates discussed:    Comments:  07/23/2014 1600 NCM spoke to pt and offered choice for [facility]. [facility] cannot services due to staffing in his area. Pt states he had [facility] in the past. Has RW and 3n1 at home. Notified [facility] for [facility] PT/OT for possible dc 9/7. Isidoro Donning RN CCM Case Mgmt phone [phone]

## 2014-07-23 NOTE — Progress Notes (Signed)
Physical Therapy Treatment Patient Details Name: Jack Barry MRN: [id number] DOB: 24-Jan-1956 Today's Date: 07/23/2014    History of Present Illness 58 yo male admitted for Rt THR revision anterior hip precautions.     PT Comments    More fatigued this PM. Pt reports feeling tired. Had 2 episodes of dizziness and mild imbalance  during  Ambulation but did not impede walking, just fatigue did.  Follow Up Recommendations  Home health PT     Equipment Recommendations  None recommended by PT    Recommendations for Other Services       Precautions / Restrictions Precautions Precautions: Anterior Hip;Fall Precaution Booklet Issued: Yes (comment) Precaution Comments: pt able to recall precautions Restrictions Other Position/Activity Restrictions: WBAT    Mobility  Bed Mobility                  Transfers Overall transfer level: Needs assistance Equipment used: Rolling walker (2 wheeled) Transfers: Sit to/from Stand Sit to Stand: Min guard         General transfer comment: extra time and effort for patient to stand  Ambulation/Gait Ambulation/Gait assistance: Min guard Ambulation Distance (Feet): 60 Feet (x2) Assistive device: Rolling walker (2 wheeled) Gait Pattern/deviations: Step-to pattern;Step-through pattern;Antalgic Gait velocity: decr   General Gait Details: cues for sequence and R leg position   Stairs            Wheelchair Mobility    Modified Rankin (Stroke Patients Only)       Balance                                    Cognition Arousal/Alertness: Awake/alert                          Exercises      General Comments        Pertinent Vitals/Pain Pain Score: 7  Pain Location: R hip Pain Descriptors / Indicators: Aching;Tightness Pain Intervention(s): Monitored during session;Premedicated before session;Repositioned    Home Living                      Prior Function             PT Goals (current goals can now be found in the care plan section) Progress towards PT goals: Progressing toward goals    Frequency  7X/week    PT Plan Current plan remains appropriate    Co-evaluation             End of Session Equipment Utilized During Treatment: Gait belt Activity Tolerance: Patient limited by fatigue Patient left: in chair;with call bell/phone within reach     Time: 4098-1191 PT Time Calculation (min): 27 min  Charges:  $Gait Training: 23-37 mins                    G Codes:      [name] 07/23/2014, 2:12 PM [place] PT [phone]

## 2014-07-23 NOTE — Progress Notes (Signed)
Subjective: 2 Days Post-Op Procedure(s) (LRB): RIGHT TOTAL HIP REVISION FEMORAL COMPONENT  (Right) CYSTOSCOPY, BALLOON DILATION, RETROGRADE URETHROGRAM (N/A) Patient reports pain as moderate.    Objective: Vital signs in last 24 hours: Temp:  [98.1 F (36.7 C)-100.1 F (37.8 C)] 99.1 F (37.3 C) (09/06 0540) Pulse Rate:  [64-76] 64 (09/06 0540) Resp:  [16-20] 16 (09/06 0540) BP: (123-139)/(57-66) 132/59 mmHg (09/06 0540) SpO2:  [82 %-100 %] 100 % (09/06 0540)  Intake/Output from previous day: 09/05 0701 - 09/06 0700 In: 2662.6 [P.O.:1440; I.V.:1222.6] Out: 1050 [Urine:1050] Intake/Output this shift: Total I/O In: -  Out: 1450 [Urine:1450]   Recent Labs  07/21/14 0555 07/22/14 0505 07/23/14 0536  HGB 12.8* 8.4* 7.8*    Recent Labs  07/22/14 0505 07/23/14 0536  WBC 15.5* 15.0*  RBC 3.11* 2.84*  HCT 26.0* 24.0*  PLT 351 322    Recent Labs  07/21/14 0555 07/22/14 0505  NA 141 136*  K 4.5 4.3  CL 100 97  CO2 28 26  BUN 30* 31*  CREATININE 1.53* 1.53*  GLUCOSE 142* 143*  CALCIUM 9.7 8.5    Recent Labs  07/21/14 0555  INR 1.08    Neurologically intact  Assessment/Plan: 2 Days Post-Op Procedure(s) (LRB): RIGHT TOTAL HIP REVISION FEMORAL COMPONENT  (Right) CYSTOSCOPY, BALLOON DILATION, RETROGRADE URETHROGRAM (N/A) Up with therapy  Hgb 7.8,   VSS.  Will see how he does with therapy, if symptomatic will transfuse.   Kambree Krauss C 07/23/2014, 8:33 AM

## 2014-07-23 NOTE — Progress Notes (Signed)
Physical Therapy Treatment Patient Details Name: Jack Barry MRN: [id number] DOB: [birth year] Today's Date: 07/23/2014    History of Present Illness      PT Comments    Pt reports feeling tired, noted sweaty after ambulating. BP pr 111/59-seated Post walk 147/59. No c/o dizziness but very fatigued.  Follow Up Recommendations  Home health PT     Equipment Recommendations  None recommended by PT    Recommendations for Other Services       Precautions / Restrictions Precautions Precautions: Anterior Hip;Fall Precaution Comments: pt able to recall precautions Restrictions Other Position/Activity Restrictions: WBAT    Mobility  Bed Mobility                  Transfers Overall transfer level: Needs assistance Equipment used: Rolling walker (2 wheeled) Transfers: Sit to/from Stand Sit to Stand: Min guard         General transfer comment: pt with good hand placement and static standing min guard (A)  Ambulation/Gait Ambulation/Gait assistance: +2 safety/equipment;Min guard Ambulation Distance (Feet): 125 Feet (then 30') Assistive device: Rolling walker (2 wheeled) Gait Pattern/deviations: Step-to pattern;Trunk flexed;Antalgic;Decreased step length - right Gait velocity: decr   General Gait Details: cues for sequence and R leg position   Stairs            Wheelchair Mobility    Modified Rankin (Stroke Patients Only)       Balance                                    Cognition Arousal/Alertness: Awake/alert                          Exercises      General Comments        Pertinent Vitals/Pain Pain Score: 5  Pain Location: R hip Pain Descriptors / Indicators: Aching    Home Living                      Prior Function            PT Goals (current goals can now be found in the care plan section) Progress towards PT goals: Progressing toward goals    Frequency  7X/week    PT Plan Current  plan remains appropriate    Co-evaluation             End of Session Equipment Utilized During Treatment: Gait belt Activity Tolerance: Patient tolerated treatment well;Patient limited by fatigue Patient left: in chair;with call bell/phone within reach     Time: 0851-0921 PT Time Calculation (min): 30 min  Charges:  $Gait Training: 23-37 mins                    G Codes:      Jack Barry 07/23/2014, 2:06 PM

## 2014-07-23 NOTE — Progress Notes (Signed)
Pt c/o feeling light-headed, saying it is the same as at home when he needs a breathing tx. Notified respiratory & tx done, pt responding well. Jaunice Mirza, [employer]

## 2014-07-24 LAB — CBC
HEMATOCRIT: 22.9 % — AB (ref 39.0–52.0)
HEMOGLOBIN: 7.3 g/dL — AB (ref 13.0–17.0)
MCH: 26.9 pg (ref 26.0–34.0)
MCHC: 31.9 g/dL (ref 30.0–36.0)
MCV: 84.5 fL (ref 78.0–100.0)
PLATELETS: 296 10*3/uL (ref 150–400)
RBC: 2.71 MIL/uL — AB (ref 4.22–5.81)
RDW: 14.7 % (ref 11.5–15.5)
WBC: 12.5 10*3/uL — AB (ref 4.0–10.5)

## 2014-07-24 LAB — GLUCOSE, CAPILLARY
GLUCOSE-CAPILLARY: 129 mg/dL — AB (ref 70–99)
GLUCOSE-CAPILLARY: 113 mg/dL — AB (ref 70–99)
GLUCOSE-CAPILLARY: 124 mg/dL — AB (ref 70–99)
Glucose-Capillary: 148 mg/dL — ABNORMAL HIGH (ref 70–99)

## 2014-07-24 LAB — PREPARE RBC (CROSSMATCH)

## 2014-07-24 MED ORDER — FUROSEMIDE 10 MG/ML IJ SOLN
20.0000 mg | Freq: Once | INTRAMUSCULAR | Status: AC
  Administered 2014-07-24: 20 mg via INTRAVENOUS
  Filled 2014-07-24: qty 2

## 2014-07-24 MED ORDER — SODIUM CHLORIDE 0.9 % IV SOLN
Freq: Once | INTRAVENOUS | Status: AC
  Administered 2014-07-24: 11:00:00 via INTRAVENOUS

## 2014-07-24 NOTE — Progress Notes (Signed)
Physical Therapy Treatment Patient Details Name: Jack Barry MRN: [id number] DOB: 12/11/1955 Today's Date: 2014-07-28    History of Present Illness 58 yo male admitted for Rt THR revision anterior hip precautions.     PT Comments      Follow Up Recommendations  Home health PT     Equipment Recommendations  None recommended by PT    Recommendations for Other Services OT consult     Precautions / Restrictions Precautions Precautions: Anterior Hip;Fall Precaution Booklet Issued: Yes (comment) Precaution Comments: pt able to recall precautions Restrictions Weight Bearing Restrictions: No Other Position/Activity Restrictions: WBAT    Mobility  Bed Mobility               General bed mobility comments: Pt up in chair on arrival and declines return to bed  Transfers Overall transfer level: Needs assistance Equipment used: Rolling walker (2 wheeled) Transfers: Sit to/from Stand Sit to Stand: Min guard         General transfer comment: extra time and effort for patient to stand  Ambulation/Gait Ambulation/Gait assistance: Min guard Ambulation Distance (Feet): 60 Feet (x2 and 15' into bathroom) Assistive device: Rolling walker (2 wheeled) Gait Pattern/deviations: Step-through pattern;Decreased step length - right;Decreased step length - left;Antalgic Gait velocity: decr   General Gait Details: cues for initial sequence and position from RW; several rests required 2* fatigue   Stairs            Wheelchair Mobility    Modified Rankin (Stroke Patients Only)       Balance                                    Cognition Arousal/Alertness: Awake/alert Behavior During Therapy: WFL for tasks assessed/performed Overall Cognitive Status: Within Functional Limits for tasks assessed                      Exercises      General Comments        Pertinent Vitals/Pain Pain Assessment: 0-10 Pain Score: 3  Pain Location: R  hip Pain Descriptors / Indicators: Aching;Sore Pain Intervention(s): Limited activity within patient's tolerance;Monitored during session;Premedicated before session;Ice applied    Home Living                      Prior Function            PT Goals (current goals can now be found in the care plan section) Acute Rehab PT Goals Patient Stated Goal: HOme PT Goal Formulation: With patient Time For Goal Achievement: 07/29/14 Potential to Achieve Goals: Good Progress towards PT goals: Progressing toward goals    Frequency  7X/week    PT Plan Current plan remains appropriate    Co-evaluation             End of Session   Activity Tolerance: Patient tolerated treatment well;Patient limited by fatigue Patient left: in chair;with call bell/phone within reach     Time: 1520-1553 PT Time Calculation (min): 33 min  Charges:  $Gait Training: 23-37 mins                    G Codes:      Jack Barry 07/28/2014, 4:36 PM

## 2014-07-24 NOTE — Progress Notes (Signed)
OT Cancellation Note  Patient Details Name: Jack Barry MRN: [id number] DOB: [birth year]   Cancelled Treatment:    Reason Eval/Treat Not Completed: Medical issues which prohibited therapy.  Pt getting blood.  Will likely check back tomorrow.  Harshil Cavallaro 07/24/2014, 9:09 AM Marica Otter, OTR/L [phone] 07/24/2014

## 2014-07-24 NOTE — Progress Notes (Signed)
Subjective: 3 Days Post-Op Procedure(s) (LRB): RIGHT TOTAL HIP REVISION FEMORAL COMPONENT  (Right) CYSTOSCOPY, BALLOON DILATION, RETROGRADE URETHROGRAM (N/A) Patient reports pain as moderate.  Some light-headness with acute blood loss anemia.  Objective: Vital signs in last 24 hours: Temp:  [98.1 F (36.7 C)-98.7 F (37.1 C)] 98.7 F (37.1 C) (09/07 0612) Pulse Rate:  [66-69] 69 (09/07 0612) Resp:  [16-20] 20 (09/07 0612) BP: (145-157)/(65-74) 154/74 mmHg (09/07 0612) SpO2:  [92 %-97 %] 95 % (09/07 0819)  Intake/Output from previous day: 09/06 0701 - 09/07 0700 In: 1968.3 [P.O.:1940; I.V.:28.3] Out: 5700 [Urine:5700] Intake/Output this shift:     Recent Labs  07/22/14 0505 07/23/14 0536 07/24/14 0502  HGB 8.4* 7.8* 7.3*    Recent Labs  07/23/14 0536 07/24/14 0502  WBC 15.0* 12.5*  RBC 2.84* 2.71*  HCT 24.0* 22.9*  PLT 322 296    Recent Labs  07/22/14 0505  NA 136*  K 4.3  CL 97  CO2 26  BUN 31*  CREATININE 1.53*  GLUCOSE 143*  CALCIUM 8.5   No results found for this basename: LABPT, INR,  in the last 72 hours  Sensation intact distally Intact pulses distally Dorsiflexion/Plantar flexion intact Incision: scant drainage  Assessment/Plan: 3 Days Post-Op Procedure(s) (LRB): RIGHT TOTAL HIP REVISION FEMORAL COMPONENT  (Right) CYSTOSCOPY, BALLOON DILATION, RETROGRADE URETHROGRAM (N/A) Plan for discharge tomorrow D/C foley. Transfuse 2 units PRBC's  Berania Peedin Y 07/24/2014, 8:48 AM

## 2014-07-25 LAB — TYPE AND SCREEN
ABO/RH(D): A POS
ANTIBODY SCREEN: NEGATIVE
UNIT DIVISION: 0
Unit division: 0

## 2014-07-25 LAB — CBC
HCT: 26.5 % — ABNORMAL LOW (ref 39.0–52.0)
HEMOGLOBIN: 8.5 g/dL — AB (ref 13.0–17.0)
MCH: 27.2 pg (ref 26.0–34.0)
MCHC: 32.1 g/dL (ref 30.0–36.0)
MCV: 84.9 fL (ref 78.0–100.0)
Platelets: 296 10*3/uL (ref 150–400)
RBC: 3.12 MIL/uL — ABNORMAL LOW (ref 4.22–5.81)
RDW: 14.7 % (ref 11.5–15.5)
WBC: 11.9 10*3/uL — AB (ref 4.0–10.5)

## 2014-07-25 LAB — GLUCOSE, CAPILLARY: GLUCOSE-CAPILLARY: 139 mg/dL — AB (ref 70–99)

## 2014-07-25 MED ORDER — HYDROCODONE-ACETAMINOPHEN 10-325 MG PO TABS: 1.0000 | ORAL_TABLET | Freq: Four times a day (QID) | ORAL | Status: AC | PRN

## 2014-07-25 MED ORDER — OXYCODONE HCL ER 40 MG PO T12A: 40.0000 mg | EXTENDED_RELEASE_TABLET | Freq: Two times a day (BID) | ORAL | Status: AC

## 2014-07-25 MED ORDER — OXYCODONE-ACETAMINOPHEN 5-325 MG PO TABS: 1.0000 | ORAL_TABLET | ORAL | Status: DC | PRN

## 2014-07-25 MED ORDER — DOXYCYCLINE HYCLATE 100 MG PO CAPS: 100.0000 mg | ORAL_CAPSULE | Freq: Two times a day (BID) | ORAL | Status: AC

## 2014-07-25 MED ORDER — FERROUS SULFATE 325 (65 FE) MG PO TABS: 325.0000 mg | ORAL_TABLET | Freq: Three times a day (TID) | ORAL | Status: AC

## 2014-07-25 NOTE — Progress Notes (Signed)
Utilization review completed.  

## 2014-07-25 NOTE — Progress Notes (Signed)
Physical Therapy Treatment Patient Details Name: Jack Barry MRN: [id number] DOB: 1956/05/18 Today's Date: 07/25/2014    History of Present Illness 58 yo male admitted for Rt THR revision anterior hip precautions.     PT Comments    Pt  Feels better,  Ready for DC after practicing step.   Follow Up Recommendations  Home health PT     Equipment Recommendations  None recommended by PT    Recommendations for Other Services       Precautions / Restrictions Precautions Precautions: Anterior Hip;Fall Precaution Booklet Issued: Yes (comment) Precaution Comments: Pt able to state all precautions. Restrictions Weight Bearing Restrictions: No Other Position/Activity Restrictions: WBAT    Mobility  Bed Mobility               General bed mobility comments: in chair  Transfers Overall transfer level: Needs assistance Equipment used: Rolling walker (2 wheeled) Transfers: Sit to/from Stand Sit to Stand: Min guard         General transfer comment: some effort to push into standing. min guard for safety to transition to reaching for walker.   Ambulation/Gait Ambulation/Gait assistance: Min guard Ambulation Distance (Feet): 100 Feet Assistive device: Rolling walker (2 wheeled) Gait Pattern/deviations: Step-to pattern;Step-through pattern;Decreased step length - left Gait velocity: decr   General Gait Details: cues for initial sequence and position from RW; several rests required 2* fatigue   Stairs Stairs: Yes Stairs assistance: Min assist Stair Management: No rails;Forwards;With walker Number of Stairs: 1 General stair comments: wife present, cues for safety, sequence  Wheelchair Mobility    Modified Rankin (Stroke Patients Only)       Balance                                    Cognition Arousal/Alertness: Awake/alert Behavior During Therapy: WFL for tasks assessed/performed Overall Cognitive Status: Within Functional Limits for  tasks assessed                      Exercises      General Comments        Pertinent Vitals/Pain Pain Assessment: 0-10 Pain Score: 7  Pain Location: R hip Pain Descriptors / Indicators: Aching Pain Intervention(s): Monitored during session;Premedicated before session;Ice applied    Home Living                      Prior Function            PT Goals (current goals can now be found in the care plan section) Progress towards PT goals: Progressing toward goals    Frequency  7X/week    PT Plan Current plan remains appropriate    Co-evaluation             End of Session   Activity Tolerance: Patient tolerated treatment well;Patient limited by fatigue Patient left: in chair;with call bell/phone within reach     Time: 1308-6578 PT Time Calculation (min): 32 min  Charges:  $Gait Training: 23-37 mins                    G Codes:      [name] 07/25/2014, 11:09 AM

## 2014-07-25 NOTE — Discharge Summary (Signed)
Patient ID: RUSTON FEDORA MRN: [id number] DOB/AGE: 1956-10-31 58 y.o.  Admit date: 07/21/2014 Discharge date: 07/25/2014  Admission Diagnoses:  Principal Problem:   Loosening of prosthetic hip Active Problems:   Status post revision of total hip replacement   Discharge Diagnoses:  Same  Past Medical History  Diagnosis Date  . Hypertension   . A-fib   . Cellulitis     BLE  . Anxiety   . Hypercholesterolemia   . Pneumonia     "several times"  . Chronic bronchitis     "gets it q yr" (04/19/2014)  . OSA on CPAP   . Type 1 diabetes mellitus   . GERD (gastroesophageal reflux disease)     "little bit" (04/19/2014)  . Depression   . Recurrent UTI (urinary tract infection)   . Chronic kidney disease     Surgeries: Procedure(s): RIGHT TOTAL HIP REVISION FEMORAL COMPONENT  CYSTOSCOPY, BALLOON DILATION, RETROGRADE URETHROGRAM on 07/21/2014   Consultants: Treatment Team:  Martina Sinner, MD  Discharged Condition: Improved  Hospital Course: BRAYAM BOEKE is an 58 y.o. male who was admitted 07/21/2014 for operative treatment ofLoosening of prosthetic hip. Patient has severe unremitting pain that affects sleep, daily activities, and work/hobbies. After pre-op clearance the patient was taken to the operating room on 07/21/2014 and underwent  Procedure(s): RIGHT TOTAL HIP REVISION FEMORAL COMPONENT  CYSTOSCOPY, BALLOON DILATION, RETROGRADE URETHROGRAM.    Patient was given perioperative antibiotics: Anti-infectives   Start     Dose/Rate Route Frequency Ordered Stop   07/25/14 0000  doxycycline (VIBRAMYCIN) 100 MG capsule     100 mg Oral 2 times daily 07/25/14 0721     07/21/14 1430  ceFAZolin (ANCEF) IVPB 2 g/50 mL premix     2 g 100 mL/hr over 30 Minutes Intravenous Every 6 hours 07/21/14 1335 07/21/14 2131   07/21/14 0600  ceFAZolin (ANCEF) 3 g in dextrose 5 % 50 mL IVPB     3 g 160 mL/hr over 30 Minutes Intravenous On call to O.R. 07/20/14 1551 07/21/14 0758        Patient was given sequential compression devices, early ambulation, and chemoprophylaxis to prevent DVT.  Patient benefited maximally from hospital stay and there were no complications.    Recent vital signs: Patient Vitals for the past 24 hrs:  BP Temp Temp src Pulse Resp SpO2  07/25/14 0601 158/63 mmHg 97.4 F (36.3 C) Oral 57 16 94 %  07/24/14 2136 143/66 mmHg 98.9 F (37.2 C) Axillary 61 17 99 %  07/24/14 2035 - - - 63 16 93 %  07/24/14 1941 - - - - - 91 %  07/24/14 1915 122/60 mmHg 98.6 F (37 C) Oral - - 100 %  07/24/14 1845 137/58 mmHg 98.7 F (37.1 C) Oral - - 96 %  07/24/14 1745 122/57 mmHg 97.8 F (36.6 C) Oral - - 98 %  07/24/14 1645 122/62 mmHg 98.6 F (37 C) Oral - - 98 %  07/24/14 1604 122/57 mmHg 98.4 F (36.9 C) Oral 62 18 98 %  07/24/14 1440 126/62 mmHg 98.6 F (37 C) Oral 60 18 94 %  07/24/14 1345 124/54 mmHg 98.4 F (36.9 C) Oral 60 18 97 %  07/24/14 1245 143/58 mmHg 97.9 F (36.6 C) Oral 64 18 95 %  07/24/14 1145 140/62 mmHg 98 F (36.7 C) Oral 60 18 98 %  07/24/14 1125 134/64 mmHg 98.2 F (36.8 C) Oral 58 18 100 %  07/24/14 1055 125/64 mmHg 98.4 F (  36.9 C) Oral 60 18 97 %  07/24/14 0819 - - - - - 95 %     Recent laboratory studies:  Recent Labs  07/24/14 0502 07/25/14 0025  WBC 12.5* 11.9*  HGB 7.3* 8.5*  HCT 22.9* 26.5*  PLT 296 296     Discharge Medications:     Medication List         amLODipine 5 MG tablet  Commonly known as:  NORVASC  Take 5 mg by mouth every morning.     aspirin 325 MG EC tablet  Take 1 tablet (325 mg total) by mouth 2 (two) times daily after a meal.     buPROPion 150 MG 24 hr tablet  Commonly known as:  WELLBUTRIN XL  Take 150 mg by mouth every morning.     carvedilol 6.25 MG tablet  Commonly known as:  COREG  Take 6.25 mg by mouth 2 (two) times daily with a meal.     citalopram 40 MG tablet  Commonly known as:  CELEXA  Take 40 mg by mouth every morning.     diltiazem 180 MG 24 hr capsule   Commonly known as:  DILACOR XR  Take 180 mg by mouth 2 (two) times daily.     doxycycline 100 MG capsule  Commonly known as:  VIBRAMYCIN  Take 1 capsule (100 mg total) by mouth 2 (two) times daily.     ferrous sulfate 325 (65 FE) MG tablet  Take 1 tablet (325 mg total) by mouth 3 (three) times daily after meals.     Fish Oil 1000 MG Caps  Take 1,000 mg by mouth daily.     gemfibrozil 600 MG tablet  Commonly known as:  LOPID  Take 600 mg by mouth 2 (two) times daily before a meal.     Glucosamine HCl 1000 MG Tabs  Take 1 tablet by mouth every morning.     HYDROcodone-acetaminophen 10-325 MG per tablet  Commonly known as:  NORCO  Take 1-2 tablets by mouth every 6 (six) hours as needed for severe pain.     insulin detemir 100 UNIT/ML injection  Commonly known as:  LEVEMIR  Inject 50 Units into the skin daily.     insulin lispro 100 UNIT/ML injection  Commonly known as:  HUMALOG  Inject 10 Units into the skin 3 (three) times daily before meals.     LORazepam 0.5 MG tablet  Commonly known as:  ATIVAN  Take 0.5 mg by mouth every 8 (eight) hours as needed for anxiety.     nitroGLYCERIN 0.2 mg/hr patch  Commonly known as:  NITRODUR - Dosed in mg/24 hr  Place 0.2 mg onto the skin daily.     OxyCODONE 40 mg T12a 12 hr tablet  Commonly known as:  OXYCONTIN  Take 1 tablet (40 mg total) by mouth every 12 (twelve) hours.     pantoprazole 40 MG tablet  Commonly known as:  PROTONIX  Take 40 mg by mouth every morning.     sotalol 120 MG tablet  Commonly known as:  BETAPACE  Take 120 mg by mouth 2 (two) times daily.     testosterone cypionate 200 MG/ML injection  Commonly known as:  DEPOTESTOTERONE CYPIONATE  Inject 200 mg into the muscle every 7 (seven) days.     tiZANidine 4 MG tablet  Commonly known as:  ZANAFLEX  Take 4 mg by mouth every 8 (eight) hours as needed for muscle spasms.  Diagnostic Studies: Dg Chest 2 View  07/21/2014   CLINICAL DATA:  Preop for  hip surgery  EXAM: CHEST  2 VIEW  COMPARISON:  04/18/2014  FINDINGS: Left basilar calcified nodule or granuloma is stable. Lungs otherwise clear. Upper normal heart size. Prominent upper mediastinum is stable. PICC removed. Right hemi shoulder arthroplasty. No pneumothorax.  IMPRESSION: No active cardiopulmonary disease.   Electronically Signed   By: Maryclare Bean M.D.   On: 07/21/2014 08:18   Dg Pelvis Portable  07/21/2014   CLINICAL DATA:  Hip replacement.  EXAM: PORTABLE PELVIS 1-2 VIEWS  COMPARISON:  None.  FINDINGS: Patient is status post total right hip replacement with good anatomic alignment on AP view. Foley catheter noted in good anatomic position. Calcified pelvic phlebolith.  IMPRESSION: 1. Patient is status post right hip replacement with good anatomic alignment on AP view. 2. Foley catheter in good anatomic position.   Electronically Signed   By: Maisie Fus  Register   On: 07/21/2014 12:46   Dg Hip Portable 1 View Right  07/21/2014   CLINICAL DATA:  Postop right hip replacement.  EXAM: PORTABLE RIGHT HIP - 1 VIEW  COMPARISON:  AP view of the right hip performed 03/01/2010. Imaging earlier today 07/21/2014.  FINDINGS: Cross-table lateral view demonstrates changes of right hip replacement. Normal alignment. No visible hardware or bony complicating feature.  IMPRESSION: Right hip replacement.  No complicating feature.   Electronically Signed   By: Charlett Nose M.D.   On: 07/21/2014 12:46   Dg C-arm 1-60 Min-no Report  07/21/2014   CLINICAL DATA: cystoscopy, balloon dilation, retrograde urethrogram   C-ARM 1-60 MINUTES  Fluoroscopy was utilized by the requesting physician.  No radiographic  interpretation.     Disposition: 06-Home-Health Care Svc      Discharge Instructions   Call MD / Call 911    Complete by:  As directed   If you experience chest pain or shortness of breath, CALL 911 and be transported to the hospital emergency room.  If you develope a fever above 101 F, pus (white drainage) or  increased drainage or redness at the wound, or calf pain, call your surgeon's office.     Constipation Prevention    Complete by:  As directed   Drink plenty of fluids.  Prune juice may be helpful.  You may use a stool softener, such as Colace (over the counter) 100 mg twice a day.  Use MiraLax (over the counter) for constipation as needed.     Diet - low sodium heart healthy    Complete by:  As directed      Discharge instructions    Complete by:  As directed   Increase activities as comfort allows. You can get your current dressing wet daily in the shower. You can leave your current dressing on until your outpatient follow-up. Do take an over-the-counter stool softener daily.     Discharge patient    Complete by:  As directed      Increase activity slowly as tolerated    Complete by:  As directed      Weight bearing as tolerated    Complete by:  As directed            Follow-up Information   Follow up with Kathryne Hitch, MD. Schedule an appointment as soon as possible for a visit in 2 weeks.   Specialty:  Orthopedic Surgery   Contact information:   [address] [place] [address] [phone]  SignedKathryne Hitch 07/25/2014, 7:23 AM

## 2014-07-25 NOTE — Progress Notes (Signed)
Occupational Therapy Treatment Patient Details Name: Jack Barry MRN: [id number] DOB: 08-Aug-1956 Today's Date: 07/25/2014    History of present illness 58 yo male admitted for Rt THR revision anterior hip precautions.    OT comments  Pt doing well. Supposed to d/c today. Reviewed hip precautions in context of ADL and functional transfers and practiced 3in1 transfer.    Follow Up Recommendations  Home health OT;Supervision - Intermittent (to address tub transfers)    Equipment Recommendations  None recommended by OT    Recommendations for Other Services      Precautions / Restrictions Precautions Precautions: Anterior Hip;Fall Precaution Comments: Pt able to state all precautions. Restrictions Weight Bearing Restrictions: No Other Position/Activity Restrictions: WBAT       Mobility Bed Mobility               General bed mobility comments: in chair  Transfers Overall transfer level: Needs assistance Equipment used: Rolling walker (2 wheeled) Transfers: Sit to/from Stand Sit to Stand: Min guard         General transfer comment: some effort to push into standing. min guard for safety to transition to reaching for walker.     Balance                                   ADL                           Toilet Transfer: Min guard;Ambulation;BSC;RW   Toileting- Clothing Manipulation and Hygiene: Supervision/safety;Sit to/from stand         General ADL Comments: Pt states he has a 3in1 and a riser with handles. Discussed making sure if he uses riser that it is appropriate for his weight. Discussed tight space in bathroom here and pt states his is also tight at home. He states he thinks he can leave walker on the outside of bathroom and walk in. Recommended pt use walker until [facility] assesses him at home and recommends when he no longer needs walker. Pt verbalized understanding. Min cues to not turn R LE outward and pt states his R LE has  always turned out somewhat since his initial surgery. Min guard for safety with walker to step around to 3in1. Reinforced making sure he doesnt take large side steps with walker in order to make sure he adheres to precautions. Pt has all AE and is famiiliar with how to use already.   pt stood at commode with walker to urinate with supervision and managing gown.      Vision                     Perception     Praxis      Cognition   Behavior During Therapy: WFL for tasks assessed/performed Overall Cognitive Status: Within Functional Limits for tasks assessed                       Extremity/Trunk Assessment               Exercises     Shoulder Instructions       General Comments      Pertinent Vitals/ Pain       Pain Assessment: 0-10 Pain Score: 6  Pain Location: R hip Pain Descriptors / Indicators: Aching Pain Intervention(s): Patient requesting pain meds-RN notified;Repositioned;Ice applied  Home  Living                                          Prior Functioning/Environment              Frequency Min 2X/week     Progress Toward Goals  OT Goals(current goals can now be found in the care plan section)  Progress towards OT goals: Progressing toward goals     Plan Discharge plan remains appropriate    Co-evaluation                 End of Session Equipment Utilized During Treatment: Rolling walker   Activity Tolerance Patient tolerated treatment well   Patient Left in chair;with call bell/phone within reach;with nursing/sitter in room   Nurse Communication          Time: [phone] OT Time Calculation (min): 25 min  Charges: OT General Charges $OT Visit: 1 Procedure OT Treatments $Self Care/Home Management : 8-22 mins $Therapeutic Activity: 8-22 mins  Lennox Laity [phone] 07/25/2014, 10:05 AM

## 2014-07-25 NOTE — Care Management Note (Signed)
    Page 1 of 1   07/25/2014     1:18:06 PM CARE MANAGEMENT NOTE 07/25/2014  Patient:  Jack Barry, Jack Barry   Account Number:  [id number]  Date Initiated:  07/23/2014  Documentation initiated by:  [facility]  Subjective/Objective Assessment:   RIGHT TOTAL HIP REVISION FEMORAL COMPONENT     Action/Plan:   lives at home with wife   Anticipated DC Date:  07/24/2014   Anticipated DC Plan:  HOME W HOME HEALTH SERVICES      DC Planning Services  CM consult      [facility]  HOME HEALTH   Choice offered to / List presented to:  C-1 Patient        HH arranged  HH-2 PT  HH-3 OT      [facility] agency  [facility]   Status of service:  Completed, signed off Medicare Important Message given?   (If response is "NO", the following Medicare IM given date fields will be blank) Date Medicare IM given:   Medicare IM given by:   Date Additional Medicare IM given:   Additional Medicare IM given by:    Discharge Disposition:  HOME W HOME HEALTH SERVICES  Per UR Regulation:    If discussed at Long Length of Stay Meetings, dates discussed:    Comments:  07/25/14 13:15 CM notified [facility] rep, Kristen of pt discharge. No other CM needs were communicated.  Freddy Jaksch, BSN, Caryl Ada [phone]. 07/23/2014 1600 NCM spoke to pt and offered choice for [facility]. [facility] cannot services due to staffing in his area. Pt states he had [facility] in the past. Has RW and 3n1 at home. Notified [facility] for [facility] PT/OT for possible dc 9/7. Isidoro Donning RN CCM Case Mgmt phone [phone]

## 2014-07-25 NOTE — Progress Notes (Signed)
Subjective: 4 Days Post-Op Procedure(s) (LRB): RIGHT TOTAL HIP REVISION FEMORAL COMPONENT  (Right) CYSTOSCOPY, BALLOON DILATION, RETROGRADE URETHROGRAM (N/A) Patient reports pain as moderate.  H/H up with transfusion.  Objective: Vital signs in last 24 hours: Temp:  [97.4 F (36.3 C)-98.9 F (37.2 C)] 97.4 F (36.3 C) (09/08 0601) Pulse Rate:  [57-64] 57 (09/08 0601) Resp:  [16-18] 16 (09/08 0601) BP: (122-158)/(54-66) 158/63 mmHg (09/08 0601) SpO2:  [91 %-100 %] 94 % (09/08 0601)  Intake/Output from previous day: 09/07 0701 - 09/08 0700 In: 2470 [P.O.:1800; Blood:670] Out: 2750 [Urine:2750] Intake/Output this shift:     Recent Labs  07/23/14 0536 07/24/14 0502 07/25/14 0025  HGB 7.8* 7.3* 8.5*    Recent Labs  07/24/14 0502 07/25/14 0025  WBC 12.5* 11.9*  RBC 2.71* 3.12*  HCT 22.9* 26.5*  PLT 296 296   No results found for this basename: NA, K, CL, CO2, BUN, CREATININE, GLUCOSE, CALCIUM,  in the last 72 hours No results found for this basename: LABPT, INR,  in the last 72 hours  Sensation intact distally Intact pulses distally Dorsiflexion/Plantar flexion intact Incision: scant drainage No cellulitis present  Assessment/Plan: 4 Days Post-Op Procedure(s) (LRB): RIGHT TOTAL HIP REVISION FEMORAL COMPONENT  (Right) CYSTOSCOPY, BALLOON DILATION, RETROGRADE URETHROGRAM (N/A) Discharge home with home health  Kathryne Hitch 07/25/2014, 7:16 AM

## 2015-05-14 ENCOUNTER — Other Ambulatory Visit: Payer: Self-pay

## 2015-12-19 DEATH — deceased

## 2016-05-31 IMAGING — DX DG PORTABLE PELVIS
1 series · 2 of 2 positions shown · non-contrast
Comparison: None.

CLINICAL DATA: Hip replacement.

EXAM:
PORTABLE PELVIS 1-2 VIEWS

[Series 1: pelvis ap · 0.14mm/px · 2 of 2 slices shown]
[im 1/2]
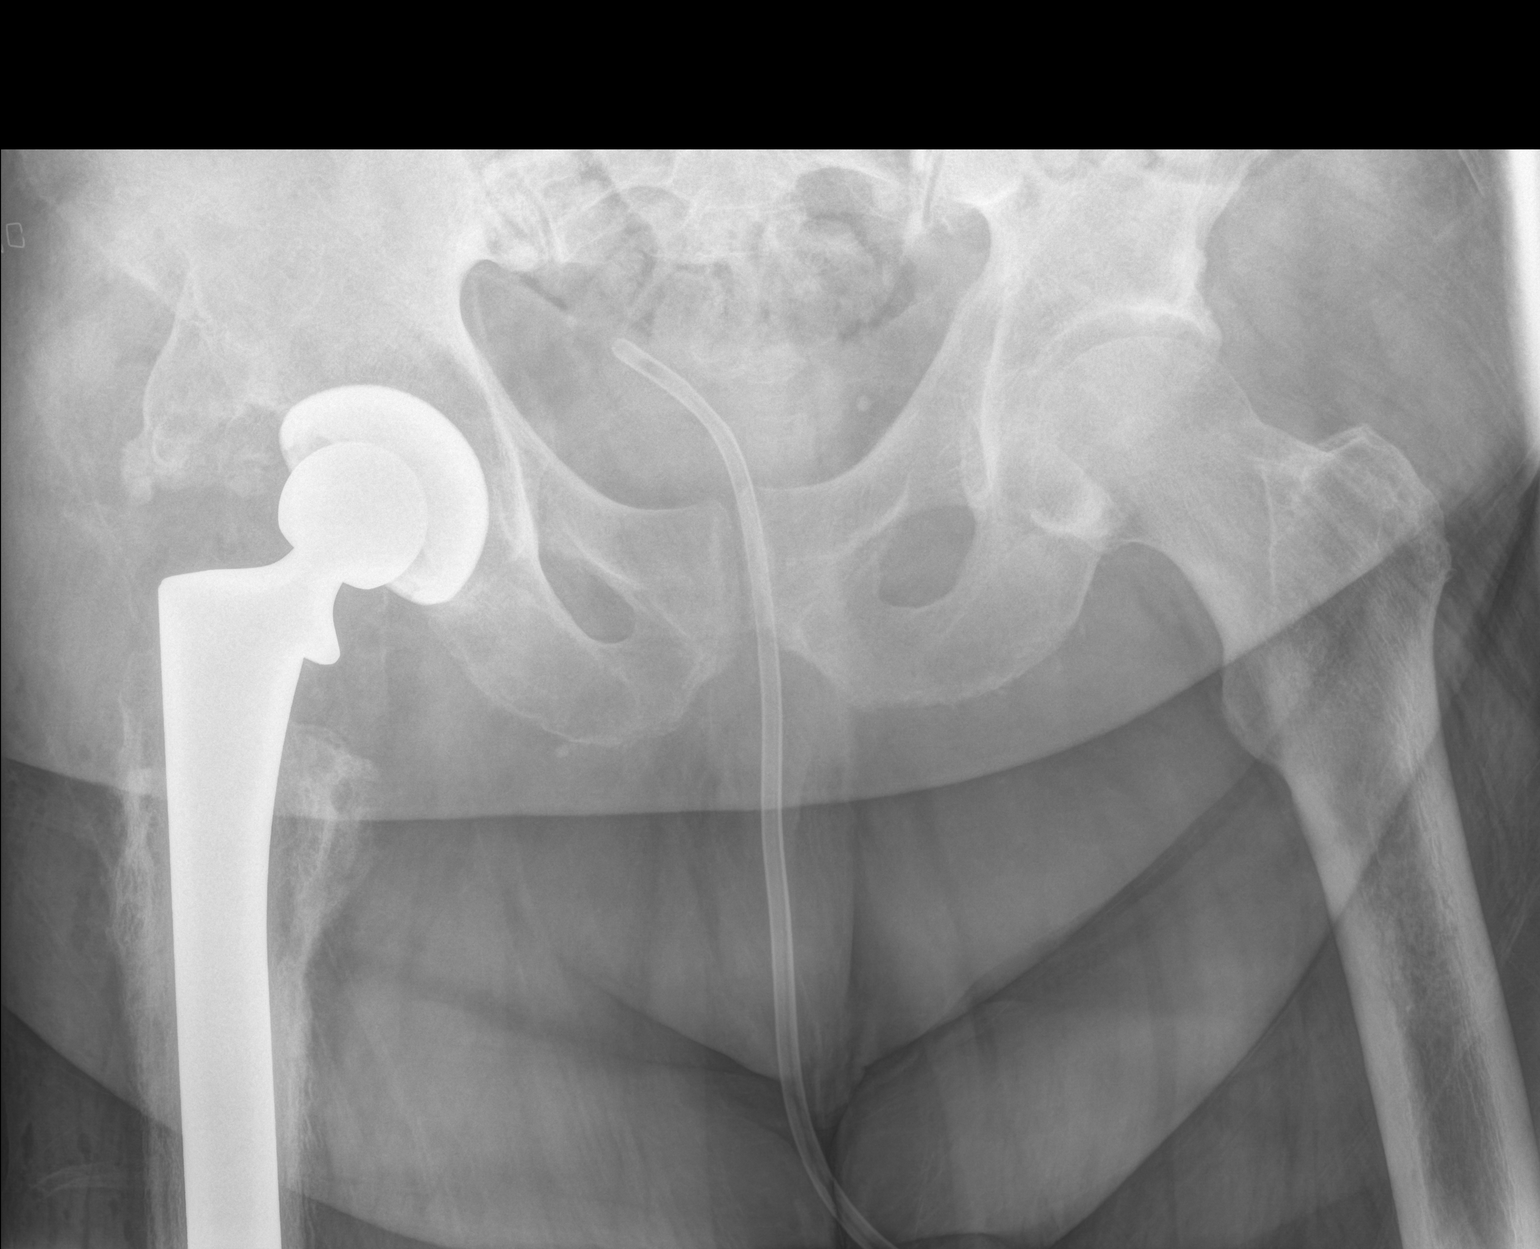
[im 2/2]
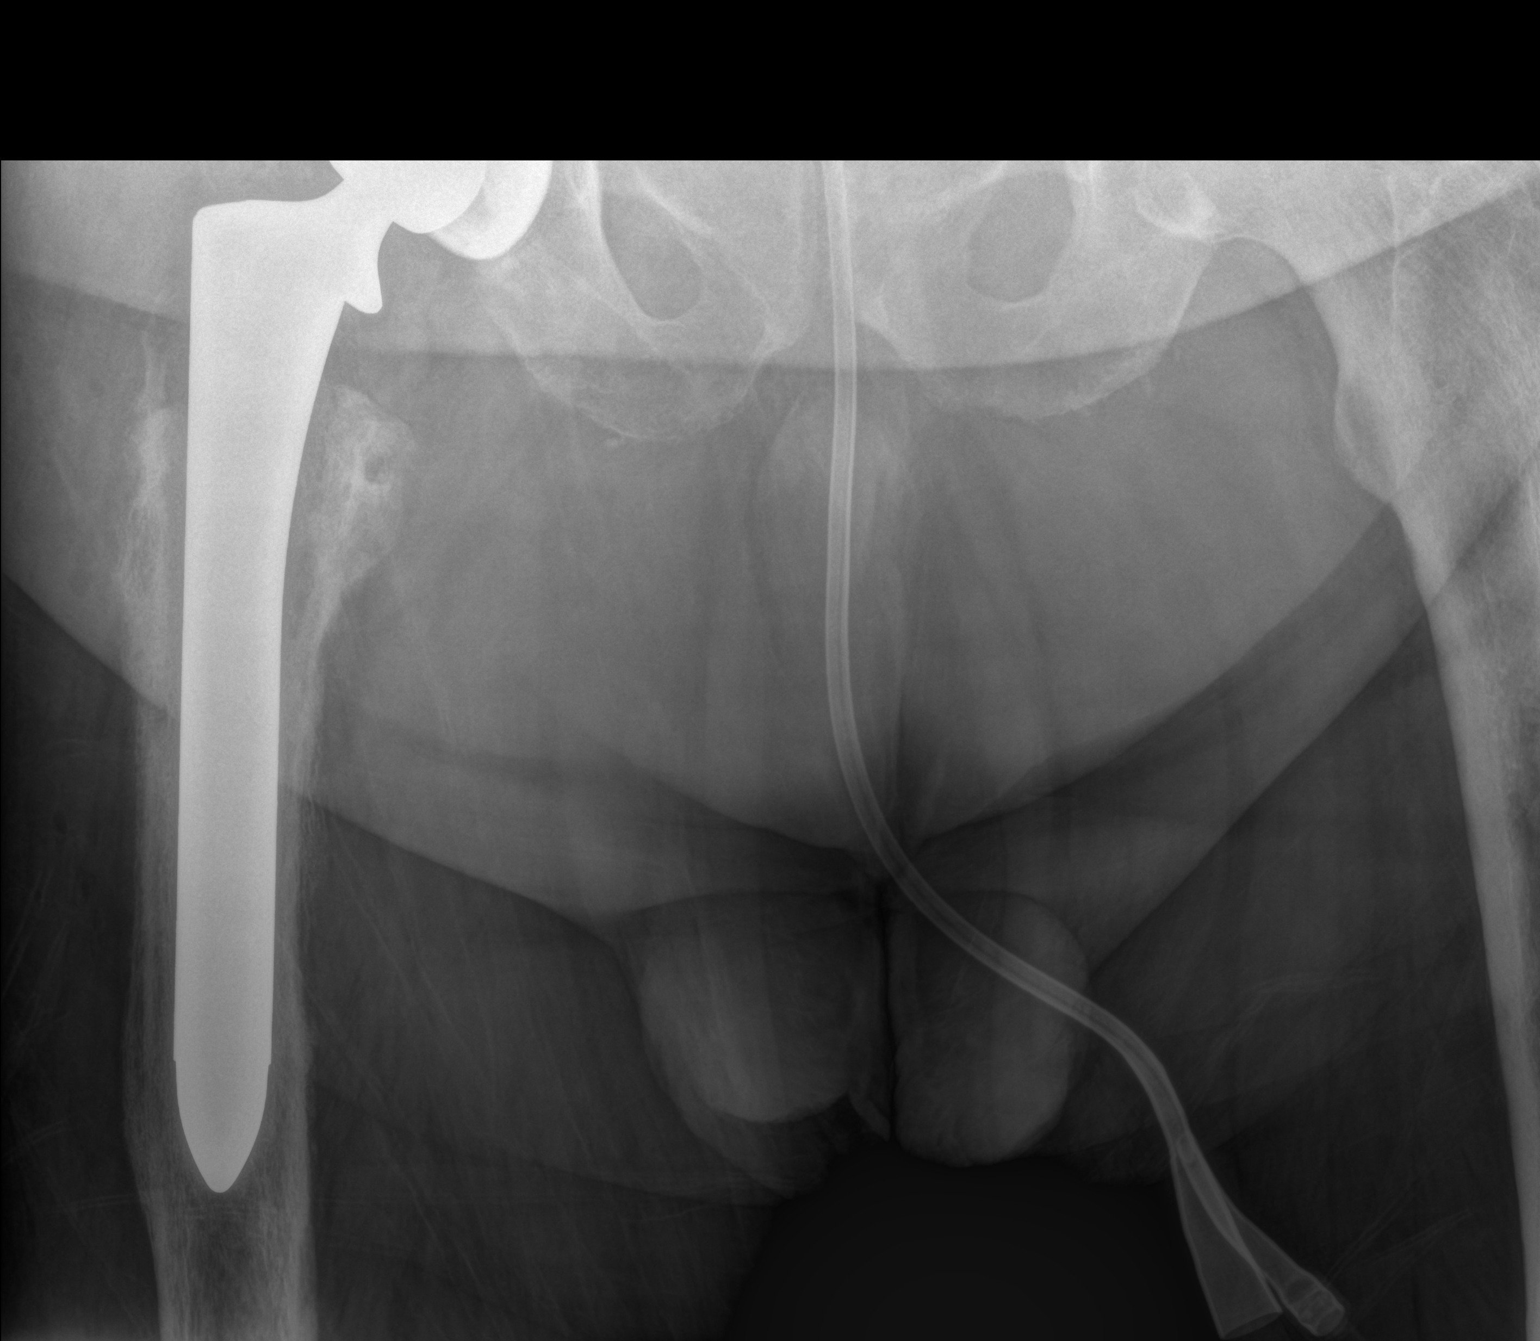

[2 of 2 positions shown; findings below may reference images not displayed]

FINDINGS: Patient is status post total right hip replacement with good
anatomic alignment on AP view. Foley catheter noted in good anatomic
position. Calcified pelvic phlebolith.
IMPRESSION: 1. Patient is status post right hip replacement with good anatomic
alignment on AP view.
2. Foley catheter in good anatomic position.
